# Patient Record
Sex: Male | Born: 1950
Health system: Southern US, Community
[De-identification: ages and names within clinical notes are randomized; demographics above are authoritative.]

## PROBLEM LIST (undated history)

## (undated) DIAGNOSIS — Z9889 Other specified postprocedural states: Secondary | ICD-10-CM

## (undated) DIAGNOSIS — N401 Enlarged prostate with lower urinary tract symptoms: Secondary | ICD-10-CM

## (undated) DIAGNOSIS — Z973 Presence of spectacles and contact lenses: Secondary | ICD-10-CM

## (undated) DIAGNOSIS — F329 Major depressive disorder, single episode, unspecified: Secondary | ICD-10-CM

## (undated) DIAGNOSIS — F411 Generalized anxiety disorder: Secondary | ICD-10-CM

## (undated) DIAGNOSIS — E785 Hyperlipidemia, unspecified: Secondary | ICD-10-CM

## (undated) DIAGNOSIS — E7800 Pure hypercholesterolemia, unspecified: Secondary | ICD-10-CM

## (undated) DIAGNOSIS — F32A Depression, unspecified: Secondary | ICD-10-CM

## (undated) DIAGNOSIS — Z8673 Personal history of transient ischemic attack (TIA), and cerebral infarction without residual deficits: Secondary | ICD-10-CM

## (undated) HISTORY — PX: TONSILLECTOMY: SUR1361

## (undated) HISTORY — DX: Pure hypercholesterolemia, unspecified: E78.00

## (undated) HISTORY — DX: Depression, unspecified: F32.A

---

## 1999-10-27 ENCOUNTER — Encounter: Payer: Self-pay | Admitting: Family Medicine

## 1999-10-27 ENCOUNTER — Encounter: Admission: RE | Admit: 1999-10-27 | Discharge: 1999-10-27 | Payer: Self-pay | Admitting: Family Medicine

## 2007-01-09 HISTORY — PX: COLONOSCOPY: SHX174

## 2013-02-02 DIAGNOSIS — J301 Allergic rhinitis due to pollen: Secondary | ICD-10-CM | POA: Diagnosis not present

## 2013-02-02 DIAGNOSIS — J343 Hypertrophy of nasal turbinates: Secondary | ICD-10-CM | POA: Diagnosis not present

## 2013-02-02 DIAGNOSIS — J342 Deviated nasal septum: Secondary | ICD-10-CM | POA: Diagnosis not present

## 2013-02-02 DIAGNOSIS — J328 Other chronic sinusitis: Secondary | ICD-10-CM | POA: Diagnosis not present

## 2013-02-23 DIAGNOSIS — J343 Hypertrophy of nasal turbinates: Secondary | ICD-10-CM | POA: Diagnosis not present

## 2013-02-23 DIAGNOSIS — J328 Other chronic sinusitis: Secondary | ICD-10-CM | POA: Diagnosis not present

## 2013-02-25 DIAGNOSIS — J301 Allergic rhinitis due to pollen: Secondary | ICD-10-CM | POA: Diagnosis not present

## 2013-03-24 DIAGNOSIS — H524 Presbyopia: Secondary | ICD-10-CM | POA: Diagnosis not present

## 2013-03-24 DIAGNOSIS — H5210 Myopia, unspecified eye: Secondary | ICD-10-CM | POA: Diagnosis not present

## 2013-03-24 DIAGNOSIS — H2510 Age-related nuclear cataract, unspecified eye: Secondary | ICD-10-CM | POA: Diagnosis not present

## 2013-03-24 DIAGNOSIS — H52229 Regular astigmatism, unspecified eye: Secondary | ICD-10-CM | POA: Diagnosis not present

## 2013-10-14 DIAGNOSIS — L821 Other seborrheic keratosis: Secondary | ICD-10-CM | POA: Diagnosis not present

## 2013-10-14 DIAGNOSIS — L570 Actinic keratosis: Secondary | ICD-10-CM | POA: Diagnosis not present

## 2013-10-14 DIAGNOSIS — D492 Neoplasm of unspecified behavior of bone, soft tissue, and skin: Secondary | ICD-10-CM | POA: Diagnosis not present

## 2013-12-16 DIAGNOSIS — L570 Actinic keratosis: Secondary | ICD-10-CM | POA: Diagnosis not present

## 2014-10-18 DIAGNOSIS — Z23 Encounter for immunization: Secondary | ICD-10-CM | POA: Diagnosis not present

## 2014-12-07 DIAGNOSIS — Z125 Encounter for screening for malignant neoplasm of prostate: Secondary | ICD-10-CM | POA: Diagnosis not present

## 2014-12-07 DIAGNOSIS — Z0000 Encounter for general adult medical examination without abnormal findings: Secondary | ICD-10-CM | POA: Diagnosis not present

## 2014-12-07 DIAGNOSIS — Z23 Encounter for immunization: Secondary | ICD-10-CM | POA: Diagnosis not present

## 2014-12-07 DIAGNOSIS — E7800 Pure hypercholesterolemia, unspecified: Secondary | ICD-10-CM | POA: Diagnosis not present

## 2014-12-13 DIAGNOSIS — Z1211 Encounter for screening for malignant neoplasm of colon: Secondary | ICD-10-CM | POA: Diagnosis not present

## 2014-12-14 DIAGNOSIS — Z125 Encounter for screening for malignant neoplasm of prostate: Secondary | ICD-10-CM | POA: Diagnosis not present

## 2014-12-14 DIAGNOSIS — Z0000 Encounter for general adult medical examination without abnormal findings: Secondary | ICD-10-CM | POA: Diagnosis not present

## 2014-12-14 DIAGNOSIS — E785 Hyperlipidemia, unspecified: Secondary | ICD-10-CM | POA: Diagnosis not present

## 2014-12-14 DIAGNOSIS — E7800 Pure hypercholesterolemia, unspecified: Secondary | ICD-10-CM | POA: Diagnosis not present

## 2014-12-16 DIAGNOSIS — L821 Other seborrheic keratosis: Secondary | ICD-10-CM | POA: Diagnosis not present

## 2014-12-16 DIAGNOSIS — L218 Other seborrheic dermatitis: Secondary | ICD-10-CM | POA: Diagnosis not present

## 2014-12-16 DIAGNOSIS — C44329 Squamous cell carcinoma of skin of other parts of face: Secondary | ICD-10-CM | POA: Diagnosis not present

## 2014-12-16 DIAGNOSIS — L812 Freckles: Secondary | ICD-10-CM | POA: Diagnosis not present

## 2014-12-16 DIAGNOSIS — L570 Actinic keratosis: Secondary | ICD-10-CM | POA: Diagnosis not present

## 2014-12-16 DIAGNOSIS — D485 Neoplasm of uncertain behavior of skin: Secondary | ICD-10-CM | POA: Diagnosis not present

## 2014-12-16 DIAGNOSIS — D1801 Hemangioma of skin and subcutaneous tissue: Secondary | ICD-10-CM | POA: Diagnosis not present

## 2014-12-28 DIAGNOSIS — C44329 Squamous cell carcinoma of skin of other parts of face: Secondary | ICD-10-CM | POA: Diagnosis not present

## 2015-03-29 DIAGNOSIS — R972 Elevated prostate specific antigen [PSA]: Secondary | ICD-10-CM | POA: Diagnosis not present

## 2015-03-29 DIAGNOSIS — E7800 Pure hypercholesterolemia, unspecified: Secondary | ICD-10-CM | POA: Diagnosis not present

## 2015-11-15 DIAGNOSIS — J069 Acute upper respiratory infection, unspecified: Secondary | ICD-10-CM | POA: Diagnosis not present

## 2015-11-24 DIAGNOSIS — Z23 Encounter for immunization: Secondary | ICD-10-CM | POA: Diagnosis not present

## 2015-12-08 DIAGNOSIS — D1801 Hemangioma of skin and subcutaneous tissue: Secondary | ICD-10-CM | POA: Diagnosis not present

## 2015-12-08 DIAGNOSIS — L814 Other melanin hyperpigmentation: Secondary | ICD-10-CM | POA: Diagnosis not present

## 2015-12-08 DIAGNOSIS — D225 Melanocytic nevi of trunk: Secondary | ICD-10-CM | POA: Diagnosis not present

## 2015-12-08 DIAGNOSIS — L570 Actinic keratosis: Secondary | ICD-10-CM | POA: Diagnosis not present

## 2015-12-08 DIAGNOSIS — L821 Other seborrheic keratosis: Secondary | ICD-10-CM | POA: Diagnosis not present

## 2015-12-08 DIAGNOSIS — Z85828 Personal history of other malignant neoplasm of skin: Secondary | ICD-10-CM | POA: Diagnosis not present

## 2015-12-13 DIAGNOSIS — E785 Hyperlipidemia, unspecified: Secondary | ICD-10-CM | POA: Diagnosis not present

## 2015-12-13 DIAGNOSIS — Z0000 Encounter for general adult medical examination without abnormal findings: Secondary | ICD-10-CM | POA: Diagnosis not present

## 2015-12-13 DIAGNOSIS — M545 Low back pain: Secondary | ICD-10-CM | POA: Diagnosis not present

## 2015-12-13 DIAGNOSIS — Z125 Encounter for screening for malignant neoplasm of prostate: Secondary | ICD-10-CM | POA: Diagnosis not present

## 2016-10-12 DIAGNOSIS — Z23 Encounter for immunization: Secondary | ICD-10-CM | POA: Diagnosis not present

## 2016-11-09 DIAGNOSIS — K649 Unspecified hemorrhoids: Secondary | ICD-10-CM | POA: Diagnosis not present

## 2016-11-09 DIAGNOSIS — M79672 Pain in left foot: Secondary | ICD-10-CM | POA: Diagnosis not present

## 2016-12-13 DIAGNOSIS — E785 Hyperlipidemia, unspecified: Secondary | ICD-10-CM | POA: Diagnosis not present

## 2016-12-13 DIAGNOSIS — Z125 Encounter for screening for malignant neoplasm of prostate: Secondary | ICD-10-CM | POA: Diagnosis not present

## 2016-12-13 DIAGNOSIS — Z1211 Encounter for screening for malignant neoplasm of colon: Secondary | ICD-10-CM | POA: Diagnosis not present

## 2016-12-13 DIAGNOSIS — Z0000 Encounter for general adult medical examination without abnormal findings: Secondary | ICD-10-CM | POA: Diagnosis not present

## 2016-12-25 DIAGNOSIS — E785 Hyperlipidemia, unspecified: Secondary | ICD-10-CM | POA: Diagnosis not present

## 2016-12-25 DIAGNOSIS — Z125 Encounter for screening for malignant neoplasm of prostate: Secondary | ICD-10-CM | POA: Diagnosis not present

## 2016-12-25 DIAGNOSIS — Z0000 Encounter for general adult medical examination without abnormal findings: Secondary | ICD-10-CM | POA: Diagnosis not present

## 2017-03-28 DIAGNOSIS — E785 Hyperlipidemia, unspecified: Secondary | ICD-10-CM | POA: Diagnosis not present

## 2017-03-28 DIAGNOSIS — R972 Elevated prostate specific antigen [PSA]: Secondary | ICD-10-CM | POA: Diagnosis not present

## 2017-04-04 DIAGNOSIS — D1801 Hemangioma of skin and subcutaneous tissue: Secondary | ICD-10-CM | POA: Diagnosis not present

## 2017-04-04 DIAGNOSIS — Z85828 Personal history of other malignant neoplasm of skin: Secondary | ICD-10-CM | POA: Diagnosis not present

## 2017-04-04 DIAGNOSIS — L814 Other melanin hyperpigmentation: Secondary | ICD-10-CM | POA: Diagnosis not present

## 2017-04-04 DIAGNOSIS — B351 Tinea unguium: Secondary | ICD-10-CM | POA: Diagnosis not present

## 2017-05-23 DIAGNOSIS — L609 Nail disorder, unspecified: Secondary | ICD-10-CM | POA: Diagnosis not present

## 2017-06-25 ENCOUNTER — Ambulatory Visit (INDEPENDENT_AMBULATORY_CARE_PROVIDER_SITE_OTHER): Payer: Medicare Other | Admitting: Podiatry

## 2017-06-25 VITALS — BP 166/92 | HR 63

## 2017-06-25 DIAGNOSIS — M2010 Hallux valgus (acquired), unspecified foot: Secondary | ICD-10-CM | POA: Diagnosis not present

## 2017-06-25 DIAGNOSIS — L608 Other nail disorders: Secondary | ICD-10-CM | POA: Diagnosis not present

## 2017-06-25 DIAGNOSIS — A499 Bacterial infection, unspecified: Secondary | ICD-10-CM | POA: Diagnosis not present

## 2017-06-25 DIAGNOSIS — L603 Nail dystrophy: Secondary | ICD-10-CM | POA: Diagnosis not present

## 2017-06-25 NOTE — Progress Notes (Signed)
Subjective:   Patient ID: Oscar Haley, male   DOB: 67 y.o.   MRN: [id number]   HPI  67 year old male presents the office today for concerns of his right big toenail occasionally causing some discomfort.  He says the nail is loose and is actually come off one time.  He denies any drainage or pus and denies any redness or drainage or any swelling.  He has no pain in the nail today but he states that certain times of pressure becomes painful.  He has nail fungus in the other nails but she has not been concerned about.  He also has bunions to both his feet that he mentions however again or not causing any pain or irritation.  Review of Systems  All other systems reviewed and are negative.  No past medical history on file.     Current Outpatient Medications:  .  buPROPion (WELLBUTRIN XL) 300 MG 24 hr tablet, , Disp: , Rfl:  .  FLUoxetine (PROZAC) 40 MG capsule, , Disp: , Rfl:  .  rosuvastatin (CRESTOR) 10 MG tablet, , Disp: , Rfl:   Not on File       Objective:  Physical Exam  General: AAO x3, NAD  Dermatological: Overall history be hypertrophic, dystrophic ill-defined discoloration.  The right hallux toenail is more dystrophic and is loose and underlying nail bed lesion on the proximal aspect.  There is no pain in the nails there is no surrounding redness or drainage or any clinical signs of infection present.  No open lesions are identified.  Vascular: Dorsalis Pedis artery and Posterior Tibial artery pedal pulses are 2/4 bilateral with immedate capillary fill time. There is no pain with calf compression, swelling, warmth, erythema.   Neruologic: Grossly intact via light touch bilateral.  Protective threshold with Semmes Wienstein monofilament intact to all pedal sites bilateral.   Musculoskeletal: No pain to the toenails today.  Significant HAV is present but there is no pain to the area.  No other areas of tenderness.  Muscular strength 5/5 in all groups tested  bilateral.  Gait: Unassisted, Nonantalgic.       Assessment:   67 year old male with right symptomatic onychodystrophy, onychomycosis, asymptomatic bunions     Plan:  -Treatment options discussed including all alternatives, risks, and complications -Etiology of symptoms were discussed -At this time the right hallux was not helping we discussed nail avulsion but he wants to hold off on that.  He states the nails, before it is been tender.  I am not sure that traditional antifungals can be helpful given the dystrophy to the nail.  I did culture the nail today I debrided the nail that any comp occasions of bleeding I sent this for pathology.  Follow-up of the nail culture sooner if needed.  Trula Slade DPM

## 2017-07-04 DIAGNOSIS — R972 Elevated prostate specific antigen [PSA]: Secondary | ICD-10-CM | POA: Diagnosis not present

## 2017-07-22 ENCOUNTER — Telehealth: Payer: Self-pay | Admitting: Podiatry

## 2017-07-22 NOTE — Telephone Encounter (Signed)
Pt was calling about biopsy results from 06/25/2017. Please give him call to let pt know what to do next

## 2017-07-22 NOTE — Telephone Encounter (Signed)
The culture did not show fungus. We can try urea cream.   Not sure why that did not get resulted to me.

## 2017-07-23 NOTE — Telephone Encounter (Signed)
I informed pt of Dr. Leigh Aurora review of results and recommendation for Revitaderm40, and it's use and cost. Pt states he may stop by the [place] office to pick up.

## 2017-08-26 DIAGNOSIS — H43813 Vitreous degeneration, bilateral: Secondary | ICD-10-CM | POA: Diagnosis not present

## 2017-08-26 DIAGNOSIS — H2513 Age-related nuclear cataract, bilateral: Secondary | ICD-10-CM | POA: Diagnosis not present

## 2017-08-26 DIAGNOSIS — H52223 Regular astigmatism, bilateral: Secondary | ICD-10-CM | POA: Diagnosis not present

## 2017-08-26 DIAGNOSIS — H524 Presbyopia: Secondary | ICD-10-CM | POA: Diagnosis not present

## 2017-08-26 DIAGNOSIS — H5213 Myopia, bilateral: Secondary | ICD-10-CM | POA: Diagnosis not present

## 2017-10-06 DIAGNOSIS — J029 Acute pharyngitis, unspecified: Secondary | ICD-10-CM | POA: Diagnosis not present

## 2017-10-06 DIAGNOSIS — J069 Acute upper respiratory infection, unspecified: Secondary | ICD-10-CM | POA: Diagnosis not present

## 2017-10-10 DIAGNOSIS — R52 Pain, unspecified: Secondary | ICD-10-CM | POA: Diagnosis not present

## 2017-10-10 DIAGNOSIS — R062 Wheezing: Secondary | ICD-10-CM | POA: Diagnosis not present

## 2017-10-10 DIAGNOSIS — J988 Other specified respiratory disorders: Secondary | ICD-10-CM | POA: Diagnosis not present

## 2017-10-17 DIAGNOSIS — R972 Elevated prostate specific antigen [PSA]: Secondary | ICD-10-CM | POA: Diagnosis not present

## 2017-11-01 DIAGNOSIS — Z23 Encounter for immunization: Secondary | ICD-10-CM | POA: Diagnosis not present

## 2017-11-14 DIAGNOSIS — N401 Enlarged prostate with lower urinary tract symptoms: Secondary | ICD-10-CM | POA: Diagnosis not present

## 2017-11-14 DIAGNOSIS — R350 Frequency of micturition: Secondary | ICD-10-CM | POA: Diagnosis not present

## 2017-11-14 DIAGNOSIS — R972 Elevated prostate specific antigen [PSA]: Secondary | ICD-10-CM | POA: Diagnosis not present

## 2018-01-21 DIAGNOSIS — E785 Hyperlipidemia, unspecified: Secondary | ICD-10-CM | POA: Diagnosis not present

## 2018-01-21 DIAGNOSIS — R972 Elevated prostate specific antigen [PSA]: Secondary | ICD-10-CM | POA: Diagnosis not present

## 2018-01-21 DIAGNOSIS — Z1211 Encounter for screening for malignant neoplasm of colon: Secondary | ICD-10-CM | POA: Diagnosis not present

## 2018-01-21 DIAGNOSIS — Z0000 Encounter for general adult medical examination without abnormal findings: Secondary | ICD-10-CM | POA: Diagnosis not present

## 2018-01-21 DIAGNOSIS — R69 Illness, unspecified: Secondary | ICD-10-CM | POA: Diagnosis not present

## 2018-02-06 DIAGNOSIS — R972 Elevated prostate specific antigen [PSA]: Secondary | ICD-10-CM | POA: Diagnosis not present

## 2018-02-13 DIAGNOSIS — N401 Enlarged prostate with lower urinary tract symptoms: Secondary | ICD-10-CM | POA: Diagnosis not present

## 2018-02-13 DIAGNOSIS — R972 Elevated prostate specific antigen [PSA]: Secondary | ICD-10-CM | POA: Diagnosis not present

## 2018-02-13 DIAGNOSIS — R350 Frequency of micturition: Secondary | ICD-10-CM | POA: Diagnosis not present

## 2018-08-12 DIAGNOSIS — R972 Elevated prostate specific antigen [PSA]: Secondary | ICD-10-CM | POA: Diagnosis not present

## 2018-08-18 DIAGNOSIS — L259 Unspecified contact dermatitis, unspecified cause: Secondary | ICD-10-CM | POA: Diagnosis not present

## 2018-08-19 DIAGNOSIS — R350 Frequency of micturition: Secondary | ICD-10-CM | POA: Diagnosis not present

## 2018-08-19 DIAGNOSIS — R972 Elevated prostate specific antigen [PSA]: Secondary | ICD-10-CM | POA: Diagnosis not present

## 2018-08-19 DIAGNOSIS — N401 Enlarged prostate with lower urinary tract symptoms: Secondary | ICD-10-CM | POA: Diagnosis not present

## 2018-10-07 DIAGNOSIS — Z23 Encounter for immunization: Secondary | ICD-10-CM | POA: Diagnosis not present

## 2018-11-27 DIAGNOSIS — I10 Essential (primary) hypertension: Secondary | ICD-10-CM | POA: Diagnosis not present

## 2018-11-27 DIAGNOSIS — R69 Illness, unspecified: Secondary | ICD-10-CM | POA: Diagnosis not present

## 2019-01-15 DIAGNOSIS — R69 Illness, unspecified: Secondary | ICD-10-CM | POA: Diagnosis not present

## 2019-02-02 DIAGNOSIS — E785 Hyperlipidemia, unspecified: Secondary | ICD-10-CM | POA: Diagnosis not present

## 2019-02-02 DIAGNOSIS — R69 Illness, unspecified: Secondary | ICD-10-CM | POA: Diagnosis not present

## 2019-02-02 DIAGNOSIS — R972 Elevated prostate specific antigen [PSA]: Secondary | ICD-10-CM | POA: Diagnosis not present

## 2019-02-02 DIAGNOSIS — Z0000 Encounter for general adult medical examination without abnormal findings: Secondary | ICD-10-CM | POA: Diagnosis not present

## 2019-02-02 DIAGNOSIS — Z1211 Encounter for screening for malignant neoplasm of colon: Secondary | ICD-10-CM | POA: Diagnosis not present

## 2019-02-17 DIAGNOSIS — E785 Hyperlipidemia, unspecified: Secondary | ICD-10-CM | POA: Diagnosis not present

## 2019-02-17 DIAGNOSIS — Z136 Encounter for screening for cardiovascular disorders: Secondary | ICD-10-CM | POA: Diagnosis not present

## 2019-02-17 DIAGNOSIS — Z0000 Encounter for general adult medical examination without abnormal findings: Secondary | ICD-10-CM | POA: Diagnosis not present

## 2019-02-24 DIAGNOSIS — R972 Elevated prostate specific antigen [PSA]: Secondary | ICD-10-CM | POA: Diagnosis not present

## 2019-03-03 DIAGNOSIS — N401 Enlarged prostate with lower urinary tract symptoms: Secondary | ICD-10-CM | POA: Diagnosis not present

## 2019-03-03 DIAGNOSIS — R972 Elevated prostate specific antigen [PSA]: Secondary | ICD-10-CM | POA: Diagnosis not present

## 2019-03-03 DIAGNOSIS — Z0000 Encounter for general adult medical examination without abnormal findings: Secondary | ICD-10-CM | POA: Diagnosis not present

## 2019-03-03 DIAGNOSIS — Z1211 Encounter for screening for malignant neoplasm of colon: Secondary | ICD-10-CM | POA: Diagnosis not present

## 2019-03-03 DIAGNOSIS — R350 Frequency of micturition: Secondary | ICD-10-CM | POA: Diagnosis not present

## 2019-04-21 DIAGNOSIS — R42 Dizziness and giddiness: Secondary | ICD-10-CM | POA: Diagnosis not present

## 2019-04-21 DIAGNOSIS — J019 Acute sinusitis, unspecified: Secondary | ICD-10-CM | POA: Diagnosis not present

## 2019-05-19 DIAGNOSIS — R69 Illness, unspecified: Secondary | ICD-10-CM | POA: Diagnosis not present

## 2019-05-28 DIAGNOSIS — R69 Illness, unspecified: Secondary | ICD-10-CM | POA: Diagnosis not present

## 2019-06-04 DIAGNOSIS — L570 Actinic keratosis: Secondary | ICD-10-CM | POA: Diagnosis not present

## 2019-06-04 DIAGNOSIS — B078 Other viral warts: Secondary | ICD-10-CM | POA: Diagnosis not present

## 2019-06-04 DIAGNOSIS — D225 Melanocytic nevi of trunk: Secondary | ICD-10-CM | POA: Diagnosis not present

## 2019-06-04 DIAGNOSIS — L853 Xerosis cutis: Secondary | ICD-10-CM | POA: Diagnosis not present

## 2019-06-04 DIAGNOSIS — Z85828 Personal history of other malignant neoplasm of skin: Secondary | ICD-10-CM | POA: Diagnosis not present

## 2019-06-04 DIAGNOSIS — D1801 Hemangioma of skin and subcutaneous tissue: Secondary | ICD-10-CM | POA: Diagnosis not present

## 2019-06-04 DIAGNOSIS — L905 Scar conditions and fibrosis of skin: Secondary | ICD-10-CM | POA: Diagnosis not present

## 2019-06-04 DIAGNOSIS — L820 Inflamed seborrheic keratosis: Secondary | ICD-10-CM | POA: Diagnosis not present

## 2019-06-04 DIAGNOSIS — L249 Irritant contact dermatitis, unspecified cause: Secondary | ICD-10-CM | POA: Diagnosis not present

## 2019-06-04 DIAGNOSIS — L821 Other seborrheic keratosis: Secondary | ICD-10-CM | POA: Diagnosis not present

## 2019-10-08 DIAGNOSIS — Z23 Encounter for immunization: Secondary | ICD-10-CM | POA: Diagnosis not present

## 2019-10-13 DIAGNOSIS — R69 Illness, unspecified: Secondary | ICD-10-CM | POA: Diagnosis not present

## 2020-02-18 DIAGNOSIS — L309 Dermatitis, unspecified: Secondary | ICD-10-CM | POA: Diagnosis not present

## 2020-02-18 DIAGNOSIS — R7309 Other abnormal glucose: Secondary | ICD-10-CM | POA: Diagnosis not present

## 2020-02-18 DIAGNOSIS — R69 Illness, unspecified: Secondary | ICD-10-CM | POA: Diagnosis not present

## 2020-02-18 DIAGNOSIS — E785 Hyperlipidemia, unspecified: Secondary | ICD-10-CM | POA: Diagnosis not present

## 2020-02-18 DIAGNOSIS — Z0000 Encounter for general adult medical examination without abnormal findings: Secondary | ICD-10-CM | POA: Diagnosis not present

## 2020-02-18 DIAGNOSIS — Z1211 Encounter for screening for malignant neoplasm of colon: Secondary | ICD-10-CM | POA: Diagnosis not present

## 2020-02-23 DIAGNOSIS — R7309 Other abnormal glucose: Secondary | ICD-10-CM | POA: Diagnosis not present

## 2020-02-23 DIAGNOSIS — E785 Hyperlipidemia, unspecified: Secondary | ICD-10-CM | POA: Diagnosis not present

## 2020-02-26 DIAGNOSIS — Z1211 Encounter for screening for malignant neoplasm of colon: Secondary | ICD-10-CM | POA: Diagnosis not present

## 2020-07-04 DIAGNOSIS — L905 Scar conditions and fibrosis of skin: Secondary | ICD-10-CM | POA: Diagnosis not present

## 2020-07-04 DIAGNOSIS — L570 Actinic keratosis: Secondary | ICD-10-CM | POA: Diagnosis not present

## 2020-07-04 DIAGNOSIS — Z85828 Personal history of other malignant neoplasm of skin: Secondary | ICD-10-CM | POA: Diagnosis not present

## 2020-07-04 DIAGNOSIS — B356 Tinea cruris: Secondary | ICD-10-CM | POA: Diagnosis not present

## 2020-07-04 DIAGNOSIS — L821 Other seborrheic keratosis: Secondary | ICD-10-CM | POA: Diagnosis not present

## 2020-07-04 DIAGNOSIS — D1801 Hemangioma of skin and subcutaneous tissue: Secondary | ICD-10-CM | POA: Diagnosis not present

## 2020-09-19 ENCOUNTER — Ambulatory Visit
Admission: RE | Admit: 2020-09-19 | Discharge: 2020-09-19 | Disposition: A | Discharge status: Home / Self Care | Payer: Medicare HMO | Source: Ambulatory Visit | Attending: Family Medicine | Admitting: Family Medicine

## 2020-09-19 ENCOUNTER — Other Ambulatory Visit: Payer: Self-pay | Admitting: Family Medicine

## 2020-09-19 DIAGNOSIS — R059 Cough, unspecified: Secondary | ICD-10-CM

## 2020-09-19 DIAGNOSIS — R42 Dizziness and giddiness: Secondary | ICD-10-CM | POA: Diagnosis not present

## 2020-09-19 DIAGNOSIS — J3489 Other specified disorders of nose and nasal sinuses: Secondary | ICD-10-CM | POA: Diagnosis not present

## 2020-09-27 DIAGNOSIS — Z23 Encounter for immunization: Secondary | ICD-10-CM | POA: Diagnosis not present

## 2020-11-08 DIAGNOSIS — R2689 Other abnormalities of gait and mobility: Secondary | ICD-10-CM | POA: Diagnosis not present

## 2020-11-15 DIAGNOSIS — H53143 Visual discomfort, bilateral: Secondary | ICD-10-CM | POA: Diagnosis not present

## 2020-12-05 ENCOUNTER — Encounter: Payer: Self-pay | Admitting: Neurology

## 2021-02-28 NOTE — Progress Notes (Signed)
° °  NEUROLOGY CONSULTATION NOTE  Oscar Haley MRN: [id number] DOB: 05-07-1950  Referring provider: London Pepper, MD Primary care provider: London Pepper, MD  Reason for consult:  dizziness  Assessment/Plan:   Disequilibrium - unclear etiology.  ENT did not think it was inner ear.  Unclear if he may have had a small cerebellar stroke.  Check MRI of brain Further recommendations pending results.   Subjective:  Oscar Haley is a 71 year old right-handed male with anxiety and HLD who presents for dizziness.  History supplemented by ENT and referring provider's notes.   While on a cruise in [place], he got up out of bed and just tipped over to the left.  It lasted a few seconds.  No associated double vision, numbness, weakness, lightheadedness or vertigo.  He continued to have spells after coming home, usually triggered when standing up, looking up or with quick head movements.  They would occur most days.  At first he had slight head pressure, so he was treated with antibiotics for presumed sinusitis but symptoms did not resolve.  Symptoms became more frequent.  He feels like he is leaning to the left.  He saw ENT in November and found to have a normal exam.  Then it resolved in December.  He has felt fine since then.  Denies prior history of vertigo.      PAST MEDICAL HISTORY: Hyperlipidemia Anxiety  MEDICATIONS: Current Outpatient Medications on File Prior to Visit  Medication Sig Dispense Refill   buPROPion (WELLBUTRIN XL) 300 MG 24 hr tablet      FLUoxetine (PROZAC) 40 MG capsule      rosuvastatin (CRESTOR) 10 MG tablet      No current facility-administered medications on file prior to visit.    ALLERGIES: NKA   Objective:  Blood pressure 132/75, pulse 68, height 5\' 3"  (1.6 m), weight 139 lb 6.4 oz (63.2 kg), SpO2 93 %. General: No acute distress.  Patient appears well-groomed.   Head:  Normocephalic/atraumatic Eyes:  fundi examined but not visualized Neck:  supple, no paraspinal tenderness, full range of motion Back: No paraspinal tenderness Heart: regular rate and rhythm Lungs: Clear to auscultation bilaterally. Vascular: No carotid bruits. Neurological Exam: Mental status: alert and oriented to person, place, and time, recent and remote memory intact, fund of knowledge intact, attention and concentration intact, speech fluent and not dysarthric, language intact. Cranial nerves: CN I: not tested CN II: pupils equal, round and reactive to light, visual fields intact CN III, IV, VI:  full range of motion, no nystagmus, no ptosis CN V: facial sensation intact. CN VII: upper and lower face symmetric CN VIII: hearing intact CN IX, X: gag intact, uvula midline CN XI: sternocleidomastoid and trapezius muscles intact CN XII: tongue midline Bulk & Tone: normal, no fasciculations. Motor:  muscle strength 5/5 throughout Sensation:  Pinprick, temperature and vibratory sensation intact. Deep Tendon Reflexes:  2+ throughout,  toes downgoing.   Finger to nose testing:  Without dysmetria.   Heel to shin:  Without dysmetria.   Gait:  Normal station and stride.  Romberg negative.    Thank you for allowing me to take part in the care of this patient.  Metta Clines, DO  CC: London Pepper, MD

## 2021-03-01 ENCOUNTER — Ambulatory Visit: Payer: Medicare HMO | Admitting: Neurology

## 2021-03-01 ENCOUNTER — Encounter: Payer: Self-pay | Admitting: Neurology

## 2021-03-01 ENCOUNTER — Other Ambulatory Visit: Payer: Self-pay

## 2021-03-01 VITALS — BP 132/75 | HR 68 | Ht 63.0 in | Wt 139.4 lb

## 2021-03-01 DIAGNOSIS — R270 Ataxia, unspecified: Secondary | ICD-10-CM | POA: Diagnosis not present

## 2021-03-01 NOTE — Patient Instructions (Signed)
Will check MRI of brain.  Further recommendations pending results Otherwise, follow up as needed

## 2021-03-13 ENCOUNTER — Ambulatory Visit
Admission: RE | Admit: 2021-03-13 | Discharge: 2021-03-13 | Disposition: A | Discharge status: Home / Self Care | Payer: Medicare HMO | Source: Ambulatory Visit | Attending: Neurology | Admitting: Neurology

## 2021-03-13 ENCOUNTER — Other Ambulatory Visit: Payer: Self-pay

## 2021-03-13 DIAGNOSIS — R270 Ataxia, unspecified: Secondary | ICD-10-CM | POA: Diagnosis not present

## 2021-03-13 DIAGNOSIS — R42 Dizziness and giddiness: Secondary | ICD-10-CM | POA: Diagnosis not present

## 2021-03-14 DIAGNOSIS — R062 Wheezing: Secondary | ICD-10-CM | POA: Diagnosis not present

## 2021-03-14 DIAGNOSIS — Z1211 Encounter for screening for malignant neoplasm of colon: Secondary | ICD-10-CM | POA: Diagnosis not present

## 2021-03-14 DIAGNOSIS — K649 Unspecified hemorrhoids: Secondary | ICD-10-CM | POA: Diagnosis not present

## 2021-03-14 DIAGNOSIS — Z125 Encounter for screening for malignant neoplasm of prostate: Secondary | ICD-10-CM | POA: Diagnosis not present

## 2021-03-14 DIAGNOSIS — M6281 Muscle weakness (generalized): Secondary | ICD-10-CM | POA: Diagnosis not present

## 2021-03-14 DIAGNOSIS — R69 Illness, unspecified: Secondary | ICD-10-CM | POA: Diagnosis not present

## 2021-03-14 DIAGNOSIS — R252 Cramp and spasm: Secondary | ICD-10-CM | POA: Diagnosis not present

## 2021-03-14 DIAGNOSIS — R7309 Other abnormal glucose: Secondary | ICD-10-CM | POA: Diagnosis not present

## 2021-03-14 DIAGNOSIS — E785 Hyperlipidemia, unspecified: Secondary | ICD-10-CM | POA: Diagnosis not present

## 2021-03-14 DIAGNOSIS — Z0000 Encounter for general adult medical examination without abnormal findings: Secondary | ICD-10-CM | POA: Diagnosis not present

## 2021-03-15 ENCOUNTER — Telehealth: Payer: Self-pay | Admitting: Neurology

## 2021-03-15 ENCOUNTER — Telehealth: Payer: Self-pay

## 2021-03-15 DIAGNOSIS — I639 Cerebral infarction, unspecified: Secondary | ICD-10-CM

## 2021-03-15 NOTE — Telephone Encounter (Signed)
-----   Message from Pieter Partridge, DO sent at 03/14/2021  7:18 AM EST ----- ?MRI of brain reveals an old small stroke in the back of the brain that may explain balance problems that started back in August.  Therefore, I would say that the balance problems are likely from this old stroke.  He is already on Crestor.  I would like him to start an aspirin '81mg'$  daily.  I would like to check tests that may reveal potential causes of stroke - CTA head and neck, echocardiogram.  If these are unremarkable, I would recommend 2 week holter monitor.  ?

## 2021-03-15 NOTE — Telephone Encounter (Signed)
Patient said he recvd a call 2x from this number, unsure what about ?

## 2021-03-15 NOTE — Progress Notes (Signed)
Tried calling pt no answer, LMOVM for the patient to call the office back.

## 2021-03-16 NOTE — Telephone Encounter (Signed)
See results note. 

## 2021-03-24 NOTE — Progress Notes (Signed)
Telephone call to [employer]: PA started will review the Auth and get back to [place]. ?Pending case # [id number] ?Fax clinical notes [phone] if needed. Insurance will let us know. ?

## 2021-03-24 NOTE — Progress Notes (Signed)
Called insurance wrong Codes given, ?[id number]  NO Required.  ?

## 2021-03-28 ENCOUNTER — Ambulatory Visit
Admission: RE | Admit: 2021-03-28 | Discharge: 2021-03-28 | Disposition: A | Discharge status: Home / Self Care | Payer: Medicare HMO | Source: Ambulatory Visit | Attending: Neurology | Admitting: Neurology

## 2021-03-28 DIAGNOSIS — I700 Atherosclerosis of aorta: Secondary | ICD-10-CM | POA: Diagnosis not present

## 2021-03-28 DIAGNOSIS — I639 Cerebral infarction, unspecified: Secondary | ICD-10-CM

## 2021-03-28 DIAGNOSIS — I63233 Cerebral infarction due to unspecified occlusion or stenosis of bilateral carotid arteries: Secondary | ICD-10-CM | POA: Diagnosis not present

## 2021-03-28 DIAGNOSIS — M47812 Spondylosis without myelopathy or radiculopathy, cervical region: Secondary | ICD-10-CM | POA: Diagnosis not present

## 2021-03-28 DIAGNOSIS — J329 Chronic sinusitis, unspecified: Secondary | ICD-10-CM | POA: Diagnosis not present

## 2021-03-28 DIAGNOSIS — I672 Cerebral atherosclerosis: Secondary | ICD-10-CM | POA: Diagnosis not present

## 2021-03-28 MED ORDER — IOPAMIDOL (ISOVUE-370) INJECTION 76%
75.0000 mL | Freq: Once | INTRAVENOUS | Status: AC | PRN
  Administered 2021-03-28: 75 mL via INTRAVENOUS

## 2021-03-29 ENCOUNTER — Other Ambulatory Visit: Payer: Self-pay

## 2021-03-29 ENCOUNTER — Ambulatory Visit: Payer: Medicare HMO

## 2021-03-29 DIAGNOSIS — Z0189 Encounter for other specified special examinations: Secondary | ICD-10-CM | POA: Diagnosis not present

## 2021-03-29 DIAGNOSIS — I639 Cerebral infarction, unspecified: Secondary | ICD-10-CM

## 2021-03-29 DIAGNOSIS — Z1211 Encounter for screening for malignant neoplasm of colon: Secondary | ICD-10-CM | POA: Diagnosis not present

## 2021-03-30 ENCOUNTER — Telehealth: Payer: Self-pay

## 2021-03-30 NOTE — Telephone Encounter (Signed)
Called [employer] [phone] option 1. Estill Bamberg and have scheduled a peer to peer with Dr. Tomi Likens at 8 am on 04/03/21. Dr. Hampton Abbot will call [phone] and provided Dr. Tomi Likens cell phone number as an alternate number. ? ?[phone] ?

## 2021-04-03 NOTE — Progress Notes (Signed)
Hi Adam, ?Hope you are feeling better. ?On a non-bubble study, I did not see ay shunting. If clinical suspicion for PFO is high, I would do a transthoracic bubble study or consider TEE. ?Hope that helps. ? ?Thanks ?Onesti Bonfiglio ? ?

## 2021-04-03 NOTE — Telephone Encounter (Signed)
PA not needed.  The wrong code for echo was entered.   ?

## 2021-04-05 NOTE — Progress Notes (Signed)
PT advised of his results.

## 2021-04-20 NOTE — Progress Notes (Signed)
LMOVM

## 2021-04-21 NOTE — Progress Notes (Signed)
Pt advised of his results.

## 2021-07-06 DIAGNOSIS — R972 Elevated prostate specific antigen [PSA]: Secondary | ICD-10-CM | POA: Diagnosis not present

## 2021-08-10 DIAGNOSIS — R972 Elevated prostate specific antigen [PSA]: Secondary | ICD-10-CM | POA: Diagnosis not present

## 2021-08-15 ENCOUNTER — Other Ambulatory Visit: Payer: Self-pay | Admitting: Urology

## 2021-08-15 DIAGNOSIS — R972 Elevated prostate specific antigen [PSA]: Secondary | ICD-10-CM

## 2021-08-28 ENCOUNTER — Ambulatory Visit
Admission: RE | Admit: 2021-08-28 | Discharge: 2021-08-28 | Disposition: A | Discharge status: Home / Self Care | Payer: Medicare HMO | Source: Ambulatory Visit | Attending: Urology | Admitting: Urology

## 2021-08-28 DIAGNOSIS — R972 Elevated prostate specific antigen [PSA]: Secondary | ICD-10-CM

## 2021-08-28 MED ORDER — GADOBENATE DIMEGLUMINE 529 MG/ML IV SOLN
15.0000 mL | Freq: Once | INTRAVENOUS | Status: AC | PRN
  Administered 2021-08-28: 15 mL via INTRAVENOUS

## 2021-10-05 DIAGNOSIS — L570 Actinic keratosis: Secondary | ICD-10-CM | POA: Diagnosis not present

## 2021-10-05 DIAGNOSIS — D225 Melanocytic nevi of trunk: Secondary | ICD-10-CM | POA: Diagnosis not present

## 2021-10-05 DIAGNOSIS — L814 Other melanin hyperpigmentation: Secondary | ICD-10-CM | POA: Diagnosis not present

## 2021-10-05 DIAGNOSIS — L538 Other specified erythematous conditions: Secondary | ICD-10-CM | POA: Diagnosis not present

## 2021-10-05 DIAGNOSIS — Z23 Encounter for immunization: Secondary | ICD-10-CM | POA: Diagnosis not present

## 2021-10-05 DIAGNOSIS — B078 Other viral warts: Secondary | ICD-10-CM | POA: Diagnosis not present

## 2021-10-05 DIAGNOSIS — L821 Other seborrheic keratosis: Secondary | ICD-10-CM | POA: Diagnosis not present

## 2021-11-08 DIAGNOSIS — C61 Malignant neoplasm of prostate: Secondary | ICD-10-CM

## 2021-11-08 DIAGNOSIS — D075 Carcinoma in situ of prostate: Secondary | ICD-10-CM | POA: Diagnosis not present

## 2021-11-08 HISTORY — DX: Malignant neoplasm of prostate: C61

## 2021-11-15 DIAGNOSIS — C61 Malignant neoplasm of prostate: Secondary | ICD-10-CM | POA: Diagnosis not present

## 2021-11-17 ENCOUNTER — Telehealth: Payer: Self-pay | Admitting: Radiation Oncology

## 2021-11-17 NOTE — Telephone Encounter (Signed)
Called patient to schedule a consultation w. Dr. Manning. No answer, LVM for a return call.  ?

## 2021-11-20 DIAGNOSIS — F325 Major depressive disorder, single episode, in full remission: Secondary | ICD-10-CM | POA: Diagnosis not present

## 2021-11-20 DIAGNOSIS — N529 Male erectile dysfunction, unspecified: Secondary | ICD-10-CM | POA: Diagnosis not present

## 2021-11-20 DIAGNOSIS — F419 Anxiety disorder, unspecified: Secondary | ICD-10-CM | POA: Diagnosis not present

## 2021-11-20 DIAGNOSIS — M1990 Unspecified osteoarthritis, unspecified site: Secondary | ICD-10-CM | POA: Diagnosis not present

## 2021-11-20 DIAGNOSIS — Z85828 Personal history of other malignant neoplasm of skin: Secondary | ICD-10-CM | POA: Diagnosis not present

## 2021-11-20 DIAGNOSIS — I700 Atherosclerosis of aorta: Secondary | ICD-10-CM | POA: Diagnosis not present

## 2021-11-20 DIAGNOSIS — Z87891 Personal history of nicotine dependence: Secondary | ICD-10-CM | POA: Diagnosis not present

## 2021-11-20 DIAGNOSIS — R69 Illness, unspecified: Secondary | ICD-10-CM | POA: Diagnosis not present

## 2021-11-20 DIAGNOSIS — R030 Elevated blood-pressure reading, without diagnosis of hypertension: Secondary | ICD-10-CM | POA: Diagnosis not present

## 2021-11-20 DIAGNOSIS — Z825 Family history of asthma and other chronic lower respiratory diseases: Secondary | ICD-10-CM | POA: Diagnosis not present

## 2021-11-20 DIAGNOSIS — E785 Hyperlipidemia, unspecified: Secondary | ICD-10-CM | POA: Diagnosis not present

## 2021-11-20 DIAGNOSIS — I672 Cerebral atherosclerosis: Secondary | ICD-10-CM | POA: Diagnosis not present

## 2021-11-20 DIAGNOSIS — Z809 Family history of malignant neoplasm, unspecified: Secondary | ICD-10-CM | POA: Diagnosis not present

## 2021-11-20 NOTE — Progress Notes (Signed)
GU Location of Tumor / Histology: Prostate Ca  If Prostate Cancer, Gleason Score is (4 + 3) and PSA is (5.59 as of 08/2021)  Biopsies      Past/Anticipated interventions by urology, if any:     Past/Anticipated interventions by medical oncology, if any: NA  Weight changes, if any:   IPSS: SHIM:  Bowel/Bladder complaints, if any: {:18581}   Nausea/Vomiting, if any: {:18581}  Pain issues, if any:  {:18581}  SAFETY ISSUES: Prior radiation? {:18581} Pacemaker/ICD? {:18581} Possible current pregnancy? Male Is the patient on methotrexate? No  Current Complaints / other details:

## 2021-11-23 ENCOUNTER — Ambulatory Visit
Admission: RE | Admit: 2021-11-23 | Discharge: 2021-11-23 | Disposition: A | Discharge status: Home / Self Care | Payer: Medicare HMO | Source: Ambulatory Visit | Attending: Radiation Oncology | Admitting: Radiation Oncology

## 2021-11-23 VITALS — BP 153/74 | HR 62 | Temp 97.7°F | Resp 18 | Ht 62.0 in | Wt 165.2 lb

## 2021-11-23 DIAGNOSIS — E7800 Pure hypercholesterolemia, unspecified: Secondary | ICD-10-CM | POA: Insufficient documentation

## 2021-11-23 DIAGNOSIS — Z79899 Other long term (current) drug therapy: Secondary | ICD-10-CM | POA: Diagnosis not present

## 2021-11-23 DIAGNOSIS — C61 Malignant neoplasm of prostate: Secondary | ICD-10-CM | POA: Insufficient documentation

## 2021-11-23 DIAGNOSIS — Z191 Hormone sensitive malignancy status: Secondary | ICD-10-CM | POA: Diagnosis not present

## 2021-11-23 NOTE — Progress Notes (Signed)
Radiation Oncology         [phone] ________________________________  Initial Outpatient Consultation  Name: Oscar Haley MRN: [id number]  Date: 11/23/2021  DOB: 07-18-1950  CC:London Pepper, MD  Festus Aloe, MD   REFERRING PHYSICIAN: Festus Aloe, MD  DIAGNOSIS: 71 y.o. gentleman with Stage T2a adenocarcinoma of the prostate with Gleason score of 4+3, and PSA of 11.8.    ICD-10-CM   1. Malignant neoplasm of prostate ([place])  C61       HISTORY OF PRESENT ILLNESS: Oscar Haley is a 71 y.o. male with a diagnosis of prostate cancer. He has a history of elevated, fluctuating PSA since 2019 when his PSA was 7.79 on routine labs with his primary care physician, Dr. Orland Mustard.  Accordingly, he was referred for evaluation in urology by Dr. Junious Silk on 11/14/17,  digital rectal examination was performed at that time revealing no nodularity or concerning findings. The PSA decreased to 6.09 when repeated at that time and down to 5.59 in 2021 but increased back up to 8.32 in 03/2021 and most recently, as high as 11.8 in 08/2021.   He had a prostate MRI on 08/28/21 that showed a PIRADS-5 lesion in the right anterior transition zone and a PIRADS-4 lesion in the left posterior peripheral zone. The patient proceeded to transrectal ultrasound with 12 biopsies of the prostate on 11/08/21.  The prostate volume measured 35 cc.  Out of 12 core biopsies, 4 were positive.  The maximum Gleason score was 4+3, and this was seen in the left base lateral and left apex. Additionally, there was Gleason 3+4 in the left mid lateral and Gleason 3+3 in the right apex.  The patient reviewed the biopsy results with his urologist and he has kindly been referred today for discussion of potential radiation treatment options.   PREVIOUS RADIATION THERAPY: No  PAST MEDICAL HISTORY:  Past Medical History:  Diagnosis Date   Depression    High cholesterol       PAST SURGICAL HISTORY:No past surgical history on  file.  FAMILY HISTORY: No family history on file.  SOCIAL HISTORY:  Social History   Socioeconomic History   Marital status: Married    Spouse name: Not on file   Number of children: Not on file   Years of education: Not on file   Highest education level: Not on file  Occupational History   Not on file  Tobacco Use   Smoking status: Never    Passive exposure: Never   Smokeless tobacco: Never  Substance and Sexual Activity   Alcohol use: Yes    Comment: occ   Drug use: Never   Sexual activity: Not on file  Other Topics Concern   Not on file  Social History Narrative   Not on file   Social Determinants of Health   Financial Resource Strain: Not on file  Food Insecurity: Not on file  Transportation Needs: Not on file  Physical Activity: Not on file  Stress: Not on file  Social Connections: Not on file  Intimate Partner Violence: Not on file    ALLERGIES: Patient has no known allergies.  MEDICATIONS:  Current Outpatient Medications  Medication Sig Dispense Refill   BOOSTRIX 5-2.5-18.5 LF-MCG/0.5 injection      buPROPion (WELLBUTRIN XL) 300 MG 24 hr tablet      FLUoxetine (PROZAC) 40 MG capsule      rosuvastatin (CRESTOR) 10 MG tablet      No current facility-administered medications for this encounter.  REVIEW OF SYSTEMS:  On review of systems, the patient reports that he is doing well overall. He denies any chest pain, shortness of breath, cough, fevers, chills, night sweats, unintended weight changes. He denies any bowel disturbances, and denies abdominal pain, nausea or vomiting. He denies any new musculoskeletal or joint aches or pains. His IPSS was 1, indicating mild urinary symptoms. His SHIM was 11, indicating he has mild--moderate erectile dysfunction. A complete review of systems is obtained and is otherwise negative.    PHYSICAL EXAM:  Wt Readings from Last 3 Encounters:  11/23/21 165 lb 3.2 oz (74.9 kg)  03/01/21 139 lb 6.4 oz (63.2 kg)   Temp  Readings from Last 3 Encounters:  11/23/21 97.7 F (36.5 C)   BP Readings from Last 3 Encounters:  11/23/21 (!) 153/74  03/01/21 132/75  06/25/17 (!) 166/92   Pulse Readings from Last 3 Encounters:  11/23/21 62  03/01/21 68  06/25/17 63   Pain Assessment Pain Score: 0-No pain/10  In general this is a well appearing Caucasian male in no acute distress. He's alert and oriented x4 and appropriate throughout the examination. Cardiopulmonary assessment is negative for acute distress, and he exhibits normal effort.     KPS = 100  100 - Normal; no complaints; no evidence of disease. 90   - Able to carry on normal activity; minor signs or symptoms of disease. 80   - Normal activity with effort; some signs or symptoms of disease. 52   - Cares for self; unable to carry on normal activity or to do active work. 60   - Requires occasional assistance, but is able to care for most of his personal needs. 50   - Requires considerable assistance and frequent medical care. 25   - Disabled; requires special care and assistance. 63   - Severely disabled; hospital admission is indicated although death not imminent. 29   - Very sick; hospital admission necessary; active supportive treatment necessary. 10   - Moribund; fatal processes progressing rapidly. 0     - Dead  Karnofsky DA, Abelmann WH, Craver LS and Burchenal JH [phone]) The use of the nitrogen mustards in the palliative treatment of carcinoma: with particular reference to bronchogenic carcinoma Cancer 1 634-56  LABORATORY DATA:  No results found for: "WBC", "HGB", "HCT", "MCV", "PLT" No results found for: "NA", "K", "CL", "CO2" No results found for: "ALT", "AST", "GGT", "ALKPHOS", "BILITOT"   RADIOGRAPHY: No results found.    IMPRESSION/PLAN: 1. 71 y.o. gentleman with Stage T2a adenocarcinoma of the prostate with Gleason Score of 4+3, and PSA of 11.8. We discussed the patient's workup and outlined the nature of prostate cancer in this  setting. The patient's T stage, Gleason's score, and PSA put him into the unfavorable intermediate risk group. Accordingly, he is eligible for a variety of potential treatment options including  prostatectomy, brachytherapy, or 5.5 weeks of external radiation +/- ST-ADT. We discussed the available radiation techniques, and focused on the details and logistics of delivery. We discussed and outlined the risks, benefits, short and long-term effects associated with radiotherapy and compared and contrasted these with prostatectomy. We discussed the role of SpaceOAR gel in reducing the rectal toxicity associated with radiotherapy. We also detailed the role of ADT in the treatment of unfavorable intermediate risk prostate cancer and outlined the associated side effects that could be expected with this therapy. We discussed that there is not great evidence to support the need for ADT in cases with low volume  Gleason 4+3 disease and that the adverse side effects likely outweigh the small potential benefit of adding this treatment to the IMRT. He was encouraged to ask questions that were answered to his stated satisfaction.  At the conclusion of our conversation, the patient is interested in moving forward with 5.5 weeks of external beam therapy without ADT if Dr. Junious Silk is in agreement. He did not recall having any conversation regarding ADT with Dr. Junious Silk but there is some mention of possibly considering Orgovyx in his last visit note. We do not feel strongly that this is necessary but will defer to Dr. Junious Silk regarding his recommendation. We will share our discussion with Dr. Junious Silk and pending he is agreement to proceed without ADT, we will make arrangements for fiducial markers and SpaceOAR gel placement, first available, prior to simulation, to reduce rectal toxicity from radiotherapy. The patient appears to have a good understanding of his disease and our treatment recommendations which are of curative  intent and is in agreement with the stated plan.  Therefore, we will move forward with treatment planning accordingly, in anticipation of beginning IMRT in the near future or approximately 2 months after starting ADT if directed by Dr. Junious Silk.  We personally spent 70 minutes in this encounter including chart review, reviewing radiological studies, meeting face-to-face with the patient, entering orders and completing documentation.    Nicholos Johns, PA-C    Tyler Pita, MD  [facility] Direct Dial: [phone]  Fax: [phone] [email or web address]  Skype  LinkedIn

## 2021-12-04 ENCOUNTER — Telehealth: Payer: Self-pay | Admitting: *Deleted

## 2021-12-04 ENCOUNTER — Other Ambulatory Visit: Payer: Self-pay | Admitting: Urology

## 2021-12-04 NOTE — Telephone Encounter (Signed)
CALLED PATIENT TO INFORM OF FID. MARKERS AND SPACE OAR PLACEMENT ON 01-05-22 AND HIS SIM ON 01-11-22- ARRIVAL TIME- 9:45 AM @ [facility],  INFORMED PATIENT TO ARRIVE WITH A FULL BLADDER

## 2021-12-26 DIAGNOSIS — U071 COVID-19: Secondary | ICD-10-CM | POA: Diagnosis not present

## 2022-01-03 ENCOUNTER — Encounter (HOSPITAL_BASED_OUTPATIENT_CLINIC_OR_DEPARTMENT_OTHER): Payer: Self-pay | Admitting: Urology

## 2022-01-03 NOTE — Progress Notes (Signed)
Spoke w/ via phone for pre-op interview--- pt Lab needs dos----  no             Lab results------ no COVID test -----patient states asymptomatic no test needed Arrive at -------  0600 on 01-05-2022 NPO after MN NO Solid Food.  Clear liquids from MN until--- 0500 Med rec completed Medications to take morning of surgery ----- wellbutrin, prozac Diabetic medication ----- n/a Patient instructed no nail polish to be worn day of surgery Patient instructed to bring photo id and insurance card day of surgery Patient aware to have Driver (ride ) / caregiver    for 24 hours after surgery -- wife, june Patient Special Instructions ----- will do fleet enema night before surgery Pre-Op special Istructions ----- n/a Patient verbalized understanding of instructions that were given at this phone interview. Patient denies shortness of breath, chest pain, fever, cough at this phone interview.

## 2022-01-05 ENCOUNTER — Other Ambulatory Visit: Payer: Self-pay

## 2022-01-05 ENCOUNTER — Ambulatory Visit (HOSPITAL_BASED_OUTPATIENT_CLINIC_OR_DEPARTMENT_OTHER): Payer: Medicare HMO | Admitting: Certified Registered"

## 2022-01-05 ENCOUNTER — Encounter (HOSPITAL_BASED_OUTPATIENT_CLINIC_OR_DEPARTMENT_OTHER)
Admission: RE | Disposition: A | Discharge status: Home / Self Care | Payer: Self-pay | Source: Home / Self Care | Attending: Urology

## 2022-01-05 ENCOUNTER — Encounter (HOSPITAL_BASED_OUTPATIENT_CLINIC_OR_DEPARTMENT_OTHER): Payer: Self-pay | Admitting: Urology

## 2022-01-05 ENCOUNTER — Ambulatory Visit (HOSPITAL_BASED_OUTPATIENT_CLINIC_OR_DEPARTMENT_OTHER)
Admission: RE | Admit: 2022-01-05 | Discharge: 2022-01-05 | Disposition: A | Discharge status: Home / Self Care | Payer: Medicare HMO | Attending: Urology | Admitting: Urology

## 2022-01-05 DIAGNOSIS — C61 Malignant neoplasm of prostate: Secondary | ICD-10-CM | POA: Diagnosis not present

## 2022-01-05 DIAGNOSIS — Z87891 Personal history of nicotine dependence: Secondary | ICD-10-CM

## 2022-01-05 DIAGNOSIS — F418 Other specified anxiety disorders: Secondary | ICD-10-CM

## 2022-01-05 DIAGNOSIS — R69 Illness, unspecified: Secondary | ICD-10-CM | POA: Diagnosis not present

## 2022-01-05 DIAGNOSIS — Z01818 Encounter for other preprocedural examination: Secondary | ICD-10-CM

## 2022-01-05 HISTORY — DX: Presence of spectacles and contact lenses: Z97.3

## 2022-01-05 HISTORY — PX: GOLD SEED IMPLANT: SHX6343

## 2022-01-05 HISTORY — DX: Generalized anxiety disorder: F41.1

## 2022-01-05 HISTORY — DX: Personal history of transient ischemic attack (TIA), and cerebral infarction without residual deficits: Z86.73

## 2022-01-05 HISTORY — DX: Benign prostatic hyperplasia with lower urinary tract symptoms: N40.1

## 2022-01-05 HISTORY — DX: Other specified postprocedural states: Z98.890

## 2022-01-05 HISTORY — PX: SPACE OAR INSTILLATION: SHX6769

## 2022-01-05 HISTORY — DX: Hyperlipidemia, unspecified: E78.5

## 2022-01-05 HISTORY — DX: Major depressive disorder, single episode, unspecified: F32.9

## 2022-01-05 SURGERY — INSERTION, GOLD SEEDS
Anesthesia: Monitor Anesthesia Care | Site: Prostate

## 2022-01-05 MED ORDER — OXYCODONE HCL 5 MG PO TABS: 5.0000 mg | ORAL_TABLET | Freq: Once | ORAL | Status: DC | PRN

## 2022-01-05 MED ORDER — HYDROMORPHONE HCL 1 MG/ML IJ SOLN: 0.2500 mg | INTRAMUSCULAR | Status: DC | PRN

## 2022-01-05 MED ORDER — KETAMINE HCL 50 MG/5ML IJ SOSY
PREFILLED_SYRINGE | INTRAMUSCULAR | Status: AC
  Filled 2022-01-05: qty 5

## 2022-01-05 MED ORDER — PROPOFOL 500 MG/50ML IV EMUL
INTRAVENOUS | Status: AC
  Filled 2022-01-05: qty 50

## 2022-01-05 MED ORDER — PROPOFOL 500 MG/50ML IV EMUL
INTRAVENOUS | Status: DC | PRN
  Administered 2022-01-05: 200 ug/kg/min via INTRAVENOUS

## 2022-01-05 MED ORDER — SODIUM CHLORIDE (PF) 0.9 % IJ SOLN
INTRAMUSCULAR | Status: DC | PRN
  Administered 2022-01-05: 10 mL via INTRAVENOUS

## 2022-01-05 MED ORDER — PROPOFOL 10 MG/ML IV BOLUS
INTRAVENOUS | Status: DC | PRN
  Administered 2022-01-05: 30 mg via INTRAVENOUS

## 2022-01-05 MED ORDER — CEFAZOLIN SODIUM-DEXTROSE 2-4 GM/100ML-% IV SOLN
2.0000 g | INTRAVENOUS | Status: AC
  Administered 2022-01-05: 2 g via INTRAVENOUS

## 2022-01-05 MED ORDER — TRAMADOL HCL 50 MG PO TABS: 50.0000 mg | ORAL_TABLET | Freq: Four times a day (QID) | ORAL | 0 refills | Status: DC | PRN

## 2022-01-05 MED ORDER — PROMETHAZINE HCL 25 MG/ML IJ SOLN: 6.2500 mg | INTRAMUSCULAR | Status: DC | PRN

## 2022-01-05 MED ORDER — FLEET ENEMA 7-19 GM/118ML RE ENEM: 1.0000 | ENEMA | Freq: Once | RECTAL | Status: DC

## 2022-01-05 MED ORDER — LACTATED RINGERS IV SOLN: INTRAVENOUS | Status: DC

## 2022-01-05 MED ORDER — KETAMINE HCL 10 MG/ML IJ SOLN
INTRAMUSCULAR | Status: DC | PRN
  Administered 2022-01-05 (×2): 10 mg via INTRAVENOUS

## 2022-01-05 MED ORDER — TAMSULOSIN HCL 0.4 MG PO CAPS: 0.4000 mg | ORAL_CAPSULE | Freq: Every day | ORAL | 11 refills | Status: AC | PRN

## 2022-01-05 MED ORDER — CEFAZOLIN SODIUM-DEXTROSE 2-4 GM/100ML-% IV SOLN
INTRAVENOUS | Status: AC
  Filled 2022-01-05: qty 100

## 2022-01-05 MED ORDER — OXYCODONE HCL 5 MG/5ML PO SOLN: 5.0000 mg | Freq: Once | ORAL | Status: DC | PRN

## 2022-01-05 MED ORDER — LIDOCAINE HCL 2 % IJ SOLN
INTRAMUSCULAR | Status: DC | PRN
  Administered 2022-01-05: 10 mL

## 2022-01-05 SURGICAL SUPPLY — 23 items
BLADE CLIPPER SENSICLIP SURGIC (BLADE) ×1 IMPLANT
CNTNR URN SCR LID CUP LEK RST (MISCELLANEOUS) ×1 IMPLANT
CONT SPEC 4OZ STRL OR WHT (MISCELLANEOUS) ×1
COVER BACK TABLE 60X90IN (DRAPES) ×1 IMPLANT
DRSG TEGADERM 4X4.75 (GAUZE/BANDAGES/DRESSINGS) ×1 IMPLANT
DRSG TEGADERM 8X12 (GAUZE/BANDAGES/DRESSINGS) ×1 IMPLANT
GLOVE BIO SURGEON STRL SZ7.5 (GLOVE) ×1 IMPLANT
GLOVE SURG SS PI 7.5 STRL IVOR (GLOVE) IMPLANT
IMPL SPACEOAR VUE SYSTEM (Spacer) ×1 IMPLANT
IMPLANT SPACEOAR VUE SYSTEM (Spacer) ×1 IMPLANT
KIT TURNOVER CYSTO (KITS) ×1 IMPLANT
MARKER GOLD PRELOAD 1.2X3 (Urological Implant) ×1 IMPLANT
MARKER SKIN DUAL TIP RULER LAB (MISCELLANEOUS) ×1 IMPLANT
NDL SPNL 22GX3.5 QUINCKE BK (NEEDLE) ×1 IMPLANT
NEEDLE SPNL 22GX3.5 QUINCKE BK (NEEDLE) ×1 IMPLANT
SEED GOLD PRELOAD 1.2X3 (Urological Implant) ×1 IMPLANT
SHEATH ULTRASOUND LTX NONSTRL (SHEATH) IMPLANT
SPIKE FLUID TRANSFER (MISCELLANEOUS) IMPLANT
SURGILUBE 2OZ TUBE FLIPTOP (MISCELLANEOUS) ×1 IMPLANT
SYR 10ML LL (SYRINGE) IMPLANT
SYR CONTROL 10ML LL (SYRINGE) ×1 IMPLANT
TOWEL OR 17X26 10 PK STRL BLUE (TOWEL DISPOSABLE) ×1 IMPLANT
UNDERPAD 30X36 HEAVY ABSORB (UNDERPADS AND DIAPERS) ×1 IMPLANT

## 2022-01-05 NOTE — Discharge Instructions (Addendum)
1 - You may have urinary urgency (bladder spasms) and bloody urine on / off while going through radiation. This is normal.  2 - Call MD or go to ER for fever >102, severe pain / nausea / vomiting not relieved by medications, inability to urinate, or acute change in medical status        Post Anesthesia Home Care Instructions  Activity: Get plenty of rest for the remainder of the day. A responsible individual must stay with you for 24 hours following the procedure.  For the next 24 hours, DO NOT: -Drive a car -Paediatric nurse -Drink alcoholic beverages -Take any medication unless instructed by your physician -Make any legal decisions or sign important papers.  Meals: Start with liquid foods such as gelatin or soup. Progress to regular foods as tolerated. Avoid greasy, spicy, heavy foods. If nausea and/or vomiting occur, drink only clear liquids until the nausea and/or vomiting subsides. Call your physician if vomiting continues.  Special Instructions/Symptoms: Your throat may feel dry or sore from the anesthesia or the breathing tube placed in your throat during surgery. If this causes discomfort, gargle with warm salt water. The discomfort should disappear within 24 hours.

## 2022-01-05 NOTE — Anesthesia Preprocedure Evaluation (Signed)
Anesthesia Evaluation  Patient identified by MRN, date of birth, ID band Patient awake    Reviewed: Allergy & Precautions, H&P , NPO status , Patient's Chart, lab work & pertinent test results  Airway Mallampati: II  TM Distance: >3 FB Neck ROM: Full    Dental no notable dental hx.    Pulmonary neg pulmonary ROS, former smoker   Pulmonary exam normal breath sounds clear to auscultation       Cardiovascular negative cardio ROS Normal cardiovascular exam Rhythm:Regular Rate:Normal     Neuro/Psych   Anxiety Depression    negative neurological ROS  negative psych ROS   GI/Hepatic negative GI ROS, Neg liver ROS,,,  Endo/Other  negative endocrine ROS    Renal/GU negative Renal ROS  negative genitourinary   Musculoskeletal negative musculoskeletal ROS (+)    Abdominal   Peds negative pediatric ROS (+)  Hematology negative hematology ROS (+)   Anesthesia Other Findings Prostate Cancer  Reproductive/Obstetrics negative OB ROS                             Anesthesia Physical Anesthesia Plan  ASA: 3  Anesthesia Plan: MAC   Post-op Pain Management: Minimal or no pain anticipated   Induction: Intravenous  PONV Risk Score and Plan: 1 and Ondansetron and Treatment may vary due to age or medical condition  Airway Management Planned: Simple Face Mask  Additional Equipment:   Intra-op Plan:   Post-operative Plan:   Informed Consent: I have reviewed the patients History and Physical, chart, labs and discussed the procedure including the risks, benefits and alternatives for the proposed anesthesia with the patient or authorized representative who has indicated his/her understanding and acceptance.     Dental advisory given  Plan Discussed with: CRNA  Anesthesia Plan Comments:        Anesthesia Quick Evaluation

## 2022-01-05 NOTE — Transfer of Care (Signed)
Immediate Anesthesia Transfer of Care Note  Patient: Oscar Haley  Procedure(s) Performed: Procedure(s) (LRB): GOLD SEED IMPLANT (N/A) SPACE OAR INSTILLATION (N/A)  Patient Location: PACU  Anesthesia Type: MAC  Level of Consciousness: awake, alert , oriented and patient cooperative  Airway & Oxygen Therapy: Patient Spontanous Breathing and Patient connected to nasal cannula  Post-op Assessment: Report given to PACU RN and Post -op Vital signs reviewed and stable  Post vital signs: Reviewed and stable  Complications: No apparent anesthesia complications Last Vitals:  Vitals Value Taken Time  BP 141/77 01/05/22 0838  Temp    Pulse 64 01/05/22 0839  Resp 19 01/05/22 0839  SpO2 98 % 01/05/22 0839  Vitals shown include unvalidated device data.  Last Pain:  Vitals:   01/05/22 0641  TempSrc: Oral  PainSc: 0-No pain      Patients Stated Pain Goal: 5 (42/87/68 1157)  Complications: No notable events documented.

## 2022-01-05 NOTE — Op Note (Signed)
NAMEMaximos Haley, [place] MEDICAL RECORD NO: [id number] ACCOUNT NO: [id number] DATE OF BIRTH: [birth year] FACILITY: [facility] LOCATION: [facility] PHYSICIAN: Alexis Frock, MD  Operative Report   DATE OF PROCEDURE: 01/05/2022  PREOPERATIVE DIAGNOSIS:  Moderate risk prostate cancer.  PROCEDURE: 1.  Prostate fiducial marker placement with transrectal ultrasound. 2.  SpaceOAR prerectal gel matrix placement.  ESTIMATED BLOOD LOSS:  Nil.  COMPLICATIONS:  None.  SPECIMEN:  None.  FINDINGS:  Excellent placement of fiducial markers and SpaceOAR.  INDICATIONS:  The patient is a 71 year old man with a history of moderate risk adenocarcinoma of the prostate, found on evaluation to have rising PSA to 11.8.  Options were discussed for management including continued surveillance protocols versus  ablative therapies versus surgical extirpation and he has decided to proceed with primary radiation as part of this. Radiation oncology team has requested fiducial marker and SpaceOAR placement to aid in targeting of radiation and to minimize GI side  effects.  Informed consent was obtained and placed in medical record.  PROCEDURE IN DETAIL:  The patient being Oscar Haley was verified, procedure being prostate fiducial markers and SpaceOAR placement was confirmed.  Procedure timeout was performed.  Intravenous antibiotics were administered. Monitored anesthesia care  conscious sedation was administered.  The patient was placed into medium lithotomy position.  Sterile field was created by first clipper shaving, then prepping and draping the patient's perineum.  His penis and scrotum were tucked out of the way using  extra large Tegaderm.  Transrectal ultrasound probe was placed per rectum.  Biplanar imaging was performed, which showed unremarkable prostate, seminal vesicles and bladder. Next, 5 mL of 2% lidocaine was placed in the subcutaneous wheal and within the  subcutaneous tissue towards the area of the apex  of the prostate.  Next, an additional 10 mL was placed directly into the apex of the prostate following a prostate block under transrectal ultrasound.  Next, fiducial markers were placed at the right mid  base, right mid apex, left mid lateral positions.  The ultrasound probe was taken off the anterior tension and using the SpaceOAR finder needle with transperineal approach, needle was advanced to the mid gland within the prerectal fat stripe.  This  corroborated by 2 mL of hydrodissection with normal saline.  Next, 10 mL of gel matrix was then instilled over 12 seconds as per manufacturer's guidelines resulted in excellent posterior displacement of anterior rectal wall.  Procedure was terminated.   The patient tolerated the procedure well, no immediate periprocedural complications.  The patient was taken to postanesthesia care unit in stable condition with plan for discharge home.   NIK D: 01/05/2022 8:36:56 am T: 01/05/2022 9:29:00 am  JOB: [id number]/ [id number]

## 2022-01-05 NOTE — H&P (Signed)
Oscar Haley is an 71 y.o. male.    Chief Complaint: Pre-Op Prostate Fiducial Marker Placement and SPACE-OAR  HPI:   1 - Moderate Risk Prostate Cancer - 4/12 up to 30% grade 3 cancer on BX 11/2021 on eval PSA 11.8. TRUS 35gm. Elects primary radiation.   Today "Oscar Haley" is seen to proceed with prostate fiducial marker placement and SPACE-OAR as part of primary prostate radiation.   Past Medical History:  Diagnosis Date   BPH associated with nocturia    GAD (generalized anxiety disorder)    History of squamous cell carcinoma excision    History of TIA (transient ischemic attack)    neruologist--- dr Tomi Likens;  lov note in epic 03-01-2021 seen for disequilibrium,  per MRI imaging small chronic left cerebellar infart white matter and mild cerebral atrophy   Hyperlipidemia    Malignant neoplasm prostate [facility]) 11/2021   urologist--- dr  eskridge/  radiation onologist--- dr Tammi Klippel;  dx 11/ 2023 Gleason 3+4   MDD (major depressive disorder)    Wears glasses     Past Surgical History:  Procedure Laterality Date   COLONOSCOPY  2009   approx   TONSILLECTOMY     age 26    History reviewed. No pertinent family history. Social History:  reports that he quit smoking about 39 years ago. His smoking use included cigarettes. He has never used smokeless tobacco. He reports current alcohol use. He reports that he does not use drugs.  Allergies: No Known Allergies  Medications Prior to Admission  Medication Sig Dispense Refill   Bioflavonoid Products (BIOFLEX) TABS Take 1 tablet by mouth 2 (two) times daily.     buPROPion (WELLBUTRIN XL) 300 MG 24 hr tablet Take 300 mg by mouth daily.     FLUoxetine (PROZAC) 40 MG capsule Take 40 mg by mouth daily.     hydrocortisone cream 1 % Apply 1 Application topically 2 (two) times daily as needed for itching.     rosuvastatin (CRESTOR) 10 MG tablet Take 10 mg by mouth at bedtime.      No results found for this or any previous visit (from the past 48  hour(s)). No results found.  Review of Systems  Constitutional:  Negative for chills and fever.  All other systems reviewed and are negative.   Height '5\' 3"'$  (1.6 m), weight 74.8 kg. Physical Exam Vitals reviewed.  HENT:     Head: Normocephalic.  Eyes:     Pupils: Pupils are equal, round, and reactive to light.  Cardiovascular:     Rate and Rhythm: Normal rate.  Pulmonary:     Effort: Pulmonary effort is normal.  Abdominal:     General: Abdomen is flat.  Genitourinary:    Comments: No CVAT at present Musculoskeletal:        General: Normal range of motion.     Cervical back: Normal range of motion.  Neurological:     General: No focal deficit present.  Psychiatric:        Mood and Affect: Mood normal.      Assessment/Plan  Proceed as planned with prostate fiducial marker and SPACE-OAR placement. Risks, benefits, alternatives, peri-procedure course discussed in detail.   Alexis Frock, MD 01/05/2022, 6:38 AM

## 2022-01-05 NOTE — Brief Op Note (Signed)
01/05/2022  8:33 AM  PATIENT:  Oscar Haley  71 y.o. male  PRE-OPERATIVE DIAGNOSIS:  PROSTATE CANCER  POST-OPERATIVE DIAGNOSIS:  * No post-op diagnosis entered *  PROCEDURE:  Procedure(s) with comments: GOLD SEED IMPLANT (N/A) SPACE OAR INSTILLATION (N/A) - 30 MINS FOR CASE  SURGEON:  Surgeon(s) and Role:    * Alexis Frock, MD - Primary  PHYSICIAN ASSISTANT:   ASSISTANTS: none   ANESTHESIA:   local and MAC  EBL:  minimal   BLOOD ADMINISTERED:none  DRAINS: none   LOCAL MEDICATIONS USED:  LIDOCAINE   SPECIMEN:  No Specimen  DISPOSITION OF SPECIMEN:  N/A  COUNTS:  YES  TOURNIQUET:  * No tourniquets in log *  DICTATION: .Other Dictation: Dictation Number [id number]  PLAN OF CARE: Discharge to home after PACU  PATIENT DISPOSITION:  PACU - hemodynamically stable.   Delay start of Pharmacological VTE agent (>24hrs) due to surgical blood loss or risk of bleeding: not applicable

## 2022-01-05 NOTE — Anesthesia Procedure Notes (Signed)
Procedure Name: MAC Date/Time: 01/05/2022 8:19 AM  Performed by: Suan Halter, CRNAPre-anesthesia Checklist: Patient identified, Emergency Drugs available, Suction available, Patient being monitored and Timeout performed Patient Re-evaluated:Patient Re-evaluated prior to induction Oxygen Delivery Method: Simple face mask

## 2022-01-05 NOTE — Anesthesia Postprocedure Evaluation (Signed)
Anesthesia Post Note  Patient: Oscar Haley  Procedure(s) Performed: GOLD SEED IMPLANT (Prostate) SPACE OAR INSTILLATION (Prostate)     Patient location during evaluation: PACU Anesthesia Type: MAC Level of consciousness: awake and alert Pain management: pain level controlled Vital Signs Assessment: post-procedure vital signs reviewed and stable Respiratory status: spontaneous breathing, nonlabored ventilation and respiratory function stable Cardiovascular status: blood pressure returned to baseline and stable Postop Assessment: no apparent nausea or vomiting Anesthetic complications: no   No notable events documented.  Last Vitals:  Vitals:   01/05/22 0845 01/05/22 0907  BP: (!) 146/80 (!) 157/85  Pulse: 67 60  Resp: 17 16  Temp:    SpO2: 96% 97%    Last Pain:  Vitals:   01/05/22 0837  TempSrc:   PainSc: 0-No pain                 Lynda Rainwater

## 2022-01-09 ENCOUNTER — Telehealth: Payer: Self-pay | Admitting: *Deleted

## 2022-01-09 NOTE — Telephone Encounter (Signed)
CALLED PATIENT TO REMIND OF SIM APPT. FOR 01-11-22- ARRIVAL TIME- 9:45 AM @ [facility], INFORMED PATIENT TO ARRIVE WITH A FULL BLADDER, LVM FOR A RETURN CALL

## 2022-01-10 ENCOUNTER — Encounter (HOSPITAL_BASED_OUTPATIENT_CLINIC_OR_DEPARTMENT_OTHER): Payer: Self-pay | Admitting: Urology

## 2022-01-10 NOTE — Progress Notes (Signed)
  Radiation Oncology         [phone]) [phone] ________________________________  Name: Oscar Haley MRN: [id number]  Date: 01/11/2022  DOB: 08/11/1950  SIMULATION AND TREATMENT PLANNING NOTE    ICD-10-CM   1. Malignant neoplasm of prostate ([place])  C61       DIAGNOSIS:  72 y.o. gentleman with Stage T2a adenocarcinoma of the prostate with Gleason score of 4+3, and PSA of 11.8.   NARRATIVE:  The patient was brought to the [facility].  Identity was confirmed.  All relevant records and images related to the planned course of therapy were reviewed.  The patient freely provided informed written consent to proceed with treatment after reviewing the details related to the planned course of therapy. The consent form was witnessed and verified by the simulation staff.  Then, the patient was set-up in a stable reproducible supine position for radiation therapy.  A vacuum lock pillow device was custom fabricated to position his legs in a reproducible immobilized position.  Then, I performed a urethrogram under sterile conditions to identify the prostatic apex.  CT images were obtained.  Surface markings were placed.  The CT images were loaded into the planning software.  Then the prostate target and avoidance structures including the rectum, bladder, bowel and hips were contoured.  Treatment planning then occurred.  The radiation prescription was entered and confirmed.  A total of one complex treatment devices was fabricated. I have requested : Intensity Modulated Radiotherapy (IMRT) is medically necessary for this case for the following reason:  Rectal sparing.Marland Kitchen  PLAN:  The patient will receive 70 Gy in 28 fractions.  ________________________________  Sheral Apley Tammi Klippel, M.D.

## 2022-01-11 ENCOUNTER — Ambulatory Visit
Admission: RE | Admit: 2022-01-11 | Discharge: 2022-01-11 | Disposition: A | Discharge status: Home / Self Care | Payer: Medicare HMO | Source: Ambulatory Visit | Attending: Radiation Oncology | Admitting: Radiation Oncology

## 2022-01-11 DIAGNOSIS — Z191 Hormone sensitive malignancy status: Secondary | ICD-10-CM | POA: Diagnosis not present

## 2022-01-11 DIAGNOSIS — C61 Malignant neoplasm of prostate: Secondary | ICD-10-CM | POA: Diagnosis not present

## 2022-01-11 DIAGNOSIS — Z510 Encounter for antineoplastic radiation therapy: Secondary | ICD-10-CM | POA: Insufficient documentation

## 2022-01-15 DIAGNOSIS — C61 Malignant neoplasm of prostate: Secondary | ICD-10-CM | POA: Diagnosis not present

## 2022-01-15 DIAGNOSIS — Z191 Hormone sensitive malignancy status: Secondary | ICD-10-CM | POA: Diagnosis not present

## 2022-01-15 DIAGNOSIS — Z510 Encounter for antineoplastic radiation therapy: Secondary | ICD-10-CM | POA: Diagnosis not present

## 2022-01-22 ENCOUNTER — Other Ambulatory Visit: Payer: Self-pay

## 2022-01-22 ENCOUNTER — Ambulatory Visit
Admission: RE | Admit: 2022-01-22 | Discharge: 2022-01-22 | Disposition: A | Discharge status: Home / Self Care | Payer: Medicare HMO | Source: Ambulatory Visit | Attending: Radiation Oncology | Admitting: Radiation Oncology

## 2022-01-22 DIAGNOSIS — C61 Malignant neoplasm of prostate: Secondary | ICD-10-CM | POA: Diagnosis not present

## 2022-01-22 DIAGNOSIS — Z191 Hormone sensitive malignancy status: Secondary | ICD-10-CM | POA: Diagnosis not present

## 2022-01-22 DIAGNOSIS — Z510 Encounter for antineoplastic radiation therapy: Secondary | ICD-10-CM | POA: Diagnosis not present

## 2022-01-22 LAB — RAD ONC ARIA SESSION SUMMARY
Course Elapsed Days: 0
Plan Fractions Treated to Date: 1
Plan Prescribed Dose Per Fraction: 2.5 Gy
Plan Total Fractions Prescribed: 28
Plan Total Prescribed Dose: 70 Gy
Reference Point Dosage Given to Date: 2.5 Gy
Reference Point Session Dosage Given: 2.5 Gy
Session Number: 1

## 2022-01-23 ENCOUNTER — Ambulatory Visit
Admission: RE | Admit: 2022-01-23 | Discharge: 2022-01-23 | Disposition: A | Discharge status: Home / Self Care | Payer: Medicare HMO | Source: Ambulatory Visit | Attending: Radiation Oncology | Admitting: Radiation Oncology

## 2022-01-23 ENCOUNTER — Other Ambulatory Visit: Payer: Self-pay

## 2022-01-23 DIAGNOSIS — C61 Malignant neoplasm of prostate: Secondary | ICD-10-CM | POA: Diagnosis not present

## 2022-01-23 DIAGNOSIS — Z191 Hormone sensitive malignancy status: Secondary | ICD-10-CM | POA: Diagnosis not present

## 2022-01-23 DIAGNOSIS — Z510 Encounter for antineoplastic radiation therapy: Secondary | ICD-10-CM | POA: Diagnosis not present

## 2022-01-23 LAB — RAD ONC ARIA SESSION SUMMARY
Course Elapsed Days: 1
Plan Fractions Treated to Date: 2
Plan Prescribed Dose Per Fraction: 2.5 Gy
Plan Total Fractions Prescribed: 28
Plan Total Prescribed Dose: 70 Gy
Reference Point Dosage Given to Date: 5 Gy
Reference Point Session Dosage Given: 2.5 Gy
Session Number: 2

## 2022-01-24 ENCOUNTER — Other Ambulatory Visit: Payer: Self-pay

## 2022-01-24 ENCOUNTER — Ambulatory Visit
Admission: RE | Admit: 2022-01-24 | Discharge: 2022-01-24 | Disposition: A | Discharge status: Home / Self Care | Payer: Medicare HMO | Source: Ambulatory Visit | Attending: Radiation Oncology | Admitting: Radiation Oncology

## 2022-01-24 DIAGNOSIS — C61 Malignant neoplasm of prostate: Secondary | ICD-10-CM | POA: Diagnosis not present

## 2022-01-24 DIAGNOSIS — Z191 Hormone sensitive malignancy status: Secondary | ICD-10-CM | POA: Diagnosis not present

## 2022-01-24 DIAGNOSIS — Z510 Encounter for antineoplastic radiation therapy: Secondary | ICD-10-CM | POA: Diagnosis not present

## 2022-01-24 LAB — RAD ONC ARIA SESSION SUMMARY
Course Elapsed Days: 2
Plan Fractions Treated to Date: 3
Plan Prescribed Dose Per Fraction: 2.5 Gy
Plan Total Fractions Prescribed: 28
Plan Total Prescribed Dose: 70 Gy
Reference Point Dosage Given to Date: 7.5 Gy
Reference Point Session Dosage Given: 2.5 Gy
Session Number: 3

## 2022-01-25 ENCOUNTER — Ambulatory Visit
Admission: RE | Admit: 2022-01-25 | Discharge: 2022-01-25 | Disposition: A | Discharge status: Home / Self Care | Payer: Medicare HMO | Source: Ambulatory Visit | Attending: Radiation Oncology | Admitting: Radiation Oncology

## 2022-01-25 ENCOUNTER — Other Ambulatory Visit: Payer: Self-pay

## 2022-01-25 ENCOUNTER — Ambulatory Visit: Payer: Medicare HMO

## 2022-01-25 DIAGNOSIS — Z191 Hormone sensitive malignancy status: Secondary | ICD-10-CM | POA: Diagnosis not present

## 2022-01-25 DIAGNOSIS — Z510 Encounter for antineoplastic radiation therapy: Secondary | ICD-10-CM | POA: Diagnosis not present

## 2022-01-25 DIAGNOSIS — C61 Malignant neoplasm of prostate: Secondary | ICD-10-CM | POA: Diagnosis not present

## 2022-01-25 LAB — RAD ONC ARIA SESSION SUMMARY
Course Elapsed Days: 3
Plan Fractions Treated to Date: 4
Plan Prescribed Dose Per Fraction: 2.5 Gy
Plan Total Fractions Prescribed: 28
Plan Total Prescribed Dose: 70 Gy
Reference Point Dosage Given to Date: 10 Gy
Reference Point Session Dosage Given: 2.5 Gy
Session Number: 4

## 2022-01-26 ENCOUNTER — Other Ambulatory Visit: Payer: Self-pay

## 2022-01-26 ENCOUNTER — Ambulatory Visit
Admission: RE | Admit: 2022-01-26 | Discharge: 2022-01-26 | Disposition: A | Discharge status: Home / Self Care | Payer: Medicare HMO | Source: Ambulatory Visit | Attending: Radiation Oncology | Admitting: Radiation Oncology

## 2022-01-26 DIAGNOSIS — Z191 Hormone sensitive malignancy status: Secondary | ICD-10-CM | POA: Diagnosis not present

## 2022-01-26 DIAGNOSIS — C61 Malignant neoplasm of prostate: Secondary | ICD-10-CM | POA: Diagnosis not present

## 2022-01-26 DIAGNOSIS — Z510 Encounter for antineoplastic radiation therapy: Secondary | ICD-10-CM | POA: Diagnosis not present

## 2022-01-26 LAB — RAD ONC ARIA SESSION SUMMARY
Course Elapsed Days: 4
Plan Fractions Treated to Date: 5
Plan Prescribed Dose Per Fraction: 2.5 Gy
Plan Total Fractions Prescribed: 28
Plan Total Prescribed Dose: 70 Gy
Reference Point Dosage Given to Date: 12.5 Gy
Reference Point Session Dosage Given: 2.5 Gy
Session Number: 5

## 2022-01-29 ENCOUNTER — Other Ambulatory Visit: Payer: Self-pay

## 2022-01-29 ENCOUNTER — Ambulatory Visit
Admission: RE | Admit: 2022-01-29 | Discharge: 2022-01-29 | Disposition: A | Discharge status: Home / Self Care | Payer: Medicare HMO | Source: Ambulatory Visit | Attending: Radiation Oncology | Admitting: Radiation Oncology

## 2022-01-29 DIAGNOSIS — Z510 Encounter for antineoplastic radiation therapy: Secondary | ICD-10-CM | POA: Diagnosis not present

## 2022-01-29 DIAGNOSIS — C61 Malignant neoplasm of prostate: Secondary | ICD-10-CM | POA: Diagnosis not present

## 2022-01-29 DIAGNOSIS — Z191 Hormone sensitive malignancy status: Secondary | ICD-10-CM | POA: Diagnosis not present

## 2022-01-29 LAB — RAD ONC ARIA SESSION SUMMARY
Course Elapsed Days: 7
Plan Fractions Treated to Date: 6
Plan Prescribed Dose Per Fraction: 2.5 Gy
Plan Total Fractions Prescribed: 28
Plan Total Prescribed Dose: 70 Gy
Reference Point Dosage Given to Date: 15 Gy
Reference Point Session Dosage Given: 2.5 Gy
Session Number: 6

## 2022-01-30 ENCOUNTER — Other Ambulatory Visit: Payer: Self-pay

## 2022-01-30 ENCOUNTER — Ambulatory Visit
Admission: RE | Admit: 2022-01-30 | Discharge: 2022-01-30 | Disposition: A | Discharge status: Home / Self Care | Payer: Medicare HMO | Source: Ambulatory Visit | Attending: Radiation Oncology | Admitting: Radiation Oncology

## 2022-01-30 DIAGNOSIS — Z191 Hormone sensitive malignancy status: Secondary | ICD-10-CM | POA: Diagnosis not present

## 2022-01-30 DIAGNOSIS — C61 Malignant neoplasm of prostate: Secondary | ICD-10-CM | POA: Diagnosis not present

## 2022-01-30 DIAGNOSIS — Z510 Encounter for antineoplastic radiation therapy: Secondary | ICD-10-CM | POA: Diagnosis not present

## 2022-01-30 LAB — RAD ONC ARIA SESSION SUMMARY
Course Elapsed Days: 8
Plan Fractions Treated to Date: 7
Plan Prescribed Dose Per Fraction: 2.5 Gy
Plan Total Fractions Prescribed: 28
Plan Total Prescribed Dose: 70 Gy
Reference Point Dosage Given to Date: 17.5 Gy
Reference Point Session Dosage Given: 2.5 Gy
Session Number: 7

## 2022-01-31 ENCOUNTER — Ambulatory Visit
Admission: RE | Admit: 2022-01-31 | Discharge: 2022-01-31 | Disposition: A | Discharge status: Home / Self Care | Payer: Medicare HMO | Source: Ambulatory Visit | Attending: Radiation Oncology | Admitting: Radiation Oncology

## 2022-01-31 ENCOUNTER — Other Ambulatory Visit: Payer: Self-pay

## 2022-01-31 DIAGNOSIS — Z191 Hormone sensitive malignancy status: Secondary | ICD-10-CM | POA: Diagnosis not present

## 2022-01-31 DIAGNOSIS — C61 Malignant neoplasm of prostate: Secondary | ICD-10-CM | POA: Diagnosis not present

## 2022-01-31 DIAGNOSIS — Z510 Encounter for antineoplastic radiation therapy: Secondary | ICD-10-CM | POA: Diagnosis not present

## 2022-01-31 LAB — RAD ONC ARIA SESSION SUMMARY
Course Elapsed Days: 9
Plan Fractions Treated to Date: 8
Plan Prescribed Dose Per Fraction: 2.5 Gy
Plan Total Fractions Prescribed: 28
Plan Total Prescribed Dose: 70 Gy
Reference Point Dosage Given to Date: 20 Gy
Reference Point Session Dosage Given: 2.5 Gy
Session Number: 8

## 2022-02-01 ENCOUNTER — Ambulatory Visit
Admission: RE | Admit: 2022-02-01 | Discharge: 2022-02-01 | Disposition: A | Discharge status: Home / Self Care | Payer: Medicare HMO | Source: Ambulatory Visit | Attending: Radiation Oncology | Admitting: Radiation Oncology

## 2022-02-01 ENCOUNTER — Other Ambulatory Visit: Payer: Self-pay

## 2022-02-01 DIAGNOSIS — C61 Malignant neoplasm of prostate: Secondary | ICD-10-CM | POA: Diagnosis not present

## 2022-02-01 DIAGNOSIS — Z191 Hormone sensitive malignancy status: Secondary | ICD-10-CM | POA: Diagnosis not present

## 2022-02-01 DIAGNOSIS — Z510 Encounter for antineoplastic radiation therapy: Secondary | ICD-10-CM | POA: Diagnosis not present

## 2022-02-01 LAB — RAD ONC ARIA SESSION SUMMARY
Course Elapsed Days: 10
Plan Fractions Treated to Date: 9
Plan Prescribed Dose Per Fraction: 2.5 Gy
Plan Total Fractions Prescribed: 28
Plan Total Prescribed Dose: 70 Gy
Reference Point Dosage Given to Date: 22.5 Gy
Reference Point Session Dosage Given: 2.5 Gy
Session Number: 9

## 2022-02-02 ENCOUNTER — Ambulatory Visit
Admission: RE | Admit: 2022-02-02 | Discharge: 2022-02-02 | Disposition: A | Discharge status: Home / Self Care | Payer: Medicare HMO | Source: Ambulatory Visit | Attending: Radiation Oncology | Admitting: Radiation Oncology

## 2022-02-02 ENCOUNTER — Other Ambulatory Visit: Payer: Self-pay

## 2022-02-02 DIAGNOSIS — Z191 Hormone sensitive malignancy status: Secondary | ICD-10-CM | POA: Diagnosis not present

## 2022-02-02 DIAGNOSIS — C61 Malignant neoplasm of prostate: Secondary | ICD-10-CM | POA: Diagnosis not present

## 2022-02-02 DIAGNOSIS — Z510 Encounter for antineoplastic radiation therapy: Secondary | ICD-10-CM | POA: Diagnosis not present

## 2022-02-02 LAB — RAD ONC ARIA SESSION SUMMARY
Course Elapsed Days: 11
Plan Fractions Treated to Date: 10
Plan Prescribed Dose Per Fraction: 2.5 Gy
Plan Total Fractions Prescribed: 28
Plan Total Prescribed Dose: 70 Gy
Reference Point Dosage Given to Date: 25 Gy
Reference Point Session Dosage Given: 2.5 Gy
Session Number: 10

## 2022-02-05 ENCOUNTER — Other Ambulatory Visit: Payer: Self-pay

## 2022-02-05 ENCOUNTER — Ambulatory Visit
Admission: RE | Admit: 2022-02-05 | Discharge: 2022-02-05 | Disposition: A | Discharge status: Home / Self Care | Payer: Medicare HMO | Source: Ambulatory Visit | Attending: Radiation Oncology | Admitting: Radiation Oncology

## 2022-02-05 DIAGNOSIS — Z510 Encounter for antineoplastic radiation therapy: Secondary | ICD-10-CM | POA: Diagnosis not present

## 2022-02-05 DIAGNOSIS — C61 Malignant neoplasm of prostate: Secondary | ICD-10-CM | POA: Diagnosis not present

## 2022-02-05 DIAGNOSIS — Z191 Hormone sensitive malignancy status: Secondary | ICD-10-CM | POA: Diagnosis not present

## 2022-02-05 LAB — RAD ONC ARIA SESSION SUMMARY
Course Elapsed Days: 14
Plan Fractions Treated to Date: 11
Plan Prescribed Dose Per Fraction: 2.5 Gy
Plan Total Fractions Prescribed: 28
Plan Total Prescribed Dose: 70 Gy
Reference Point Dosage Given to Date: 27.5 Gy
Reference Point Session Dosage Given: 2.5 Gy
Session Number: 11

## 2022-02-06 ENCOUNTER — Telehealth: Payer: Self-pay

## 2022-02-06 ENCOUNTER — Other Ambulatory Visit: Payer: Self-pay

## 2022-02-06 ENCOUNTER — Ambulatory Visit
Admission: RE | Admit: 2022-02-06 | Discharge: 2022-02-06 | Disposition: A | Discharge status: Home / Self Care | Payer: Medicare HMO | Source: Ambulatory Visit | Attending: Radiation Oncology | Admitting: Radiation Oncology

## 2022-02-06 DIAGNOSIS — C61 Malignant neoplasm of prostate: Secondary | ICD-10-CM | POA: Diagnosis not present

## 2022-02-06 DIAGNOSIS — Z191 Hormone sensitive malignancy status: Secondary | ICD-10-CM | POA: Diagnosis not present

## 2022-02-06 DIAGNOSIS — Z510 Encounter for antineoplastic radiation therapy: Secondary | ICD-10-CM | POA: Diagnosis not present

## 2022-02-06 LAB — RAD ONC ARIA SESSION SUMMARY
Course Elapsed Days: 15
Plan Fractions Treated to Date: 12
Plan Prescribed Dose Per Fraction: 2.5 Gy
Plan Total Fractions Prescribed: 28
Plan Total Prescribed Dose: 70 Gy
Reference Point Dosage Given to Date: 30 Gy
Reference Point Session Dosage Given: 2.5 Gy
Session Number: 12

## 2022-02-06 NOTE — Telephone Encounter (Signed)
RN called patient to follow up on concerns of bleeding hemorrhoids x 1 week  Patient said that he had hemorrhoids before and feels like it could be for when had seed implant.  Reports After confirming identity Mr. Coberly and wife in background discussed concerns.  Wife reports it looks more like a laceration rather then hemorrhoids bleeds when wiping with toilet paper wants someone to take a look and discuss treatment options.  Location between anus and scrotum.  Mrs. Sollenberger and patient want to make sure is not radiation related.  Mr. Crymes stated he would have his wife take a picture and when he comes in for treatment this morning at 10 am will show nurse.  Until then RN told patient to use moist wipes and Sitz bath for comfort.  Both patient and wife verbalized understanding and appreciated the call.  Will report finding to [employer], PA-C.

## 2022-02-07 ENCOUNTER — Other Ambulatory Visit: Payer: Self-pay

## 2022-02-07 ENCOUNTER — Ambulatory Visit
Admission: RE | Admit: 2022-02-07 | Discharge: 2022-02-07 | Disposition: A | Discharge status: Home / Self Care | Payer: Medicare HMO | Source: Ambulatory Visit | Attending: Radiation Oncology | Admitting: Radiation Oncology

## 2022-02-07 DIAGNOSIS — Z191 Hormone sensitive malignancy status: Secondary | ICD-10-CM | POA: Diagnosis not present

## 2022-02-07 DIAGNOSIS — C61 Malignant neoplasm of prostate: Secondary | ICD-10-CM | POA: Diagnosis not present

## 2022-02-07 DIAGNOSIS — Z510 Encounter for antineoplastic radiation therapy: Secondary | ICD-10-CM | POA: Diagnosis not present

## 2022-02-07 LAB — RAD ONC ARIA SESSION SUMMARY
Course Elapsed Days: 16
Plan Fractions Treated to Date: 13
Plan Prescribed Dose Per Fraction: 2.5 Gy
Plan Total Fractions Prescribed: 28
Plan Total Prescribed Dose: 70 Gy
Reference Point Dosage Given to Date: 32.5 Gy
Reference Point Session Dosage Given: 2.5 Gy
Session Number: 13

## 2022-02-08 ENCOUNTER — Ambulatory Visit
Admission: RE | Admit: 2022-02-08 | Discharge: 2022-02-08 | Disposition: A | Discharge status: Home / Self Care | Payer: Medicare HMO | Source: Ambulatory Visit | Attending: Radiation Oncology | Admitting: Radiation Oncology

## 2022-02-08 ENCOUNTER — Other Ambulatory Visit: Payer: Self-pay

## 2022-02-08 DIAGNOSIS — Z510 Encounter for antineoplastic radiation therapy: Secondary | ICD-10-CM | POA: Diagnosis not present

## 2022-02-08 DIAGNOSIS — C61 Malignant neoplasm of prostate: Secondary | ICD-10-CM | POA: Insufficient documentation

## 2022-02-08 DIAGNOSIS — Z191 Hormone sensitive malignancy status: Secondary | ICD-10-CM | POA: Diagnosis not present

## 2022-02-08 LAB — RAD ONC ARIA SESSION SUMMARY
Course Elapsed Days: 17
Plan Fractions Treated to Date: 14
Plan Prescribed Dose Per Fraction: 2.5 Gy
Plan Total Fractions Prescribed: 28
Plan Total Prescribed Dose: 70 Gy
Reference Point Dosage Given to Date: 35 Gy
Reference Point Session Dosage Given: 2.5 Gy
Session Number: 14

## 2022-02-09 ENCOUNTER — Other Ambulatory Visit: Payer: Self-pay

## 2022-02-09 ENCOUNTER — Ambulatory Visit
Admission: RE | Admit: 2022-02-09 | Discharge: 2022-02-09 | Disposition: A | Discharge status: Home / Self Care | Payer: Medicare HMO | Source: Ambulatory Visit | Attending: Radiation Oncology | Admitting: Radiation Oncology

## 2022-02-09 DIAGNOSIS — Z510 Encounter for antineoplastic radiation therapy: Secondary | ICD-10-CM | POA: Diagnosis not present

## 2022-02-09 DIAGNOSIS — C61 Malignant neoplasm of prostate: Secondary | ICD-10-CM | POA: Diagnosis not present

## 2022-02-09 DIAGNOSIS — Z191 Hormone sensitive malignancy status: Secondary | ICD-10-CM | POA: Diagnosis not present

## 2022-02-09 LAB — RAD ONC ARIA SESSION SUMMARY
Course Elapsed Days: 18
Plan Fractions Treated to Date: 15
Plan Prescribed Dose Per Fraction: 2.5 Gy
Plan Total Fractions Prescribed: 28
Plan Total Prescribed Dose: 70 Gy
Reference Point Dosage Given to Date: 37.5 Gy
Reference Point Session Dosage Given: 2.5 Gy
Session Number: 15

## 2022-02-12 ENCOUNTER — Other Ambulatory Visit: Payer: Self-pay

## 2022-02-12 ENCOUNTER — Ambulatory Visit
Admission: RE | Admit: 2022-02-12 | Discharge: 2022-02-12 | Disposition: A | Discharge status: Home / Self Care | Payer: Medicare HMO | Source: Ambulatory Visit | Attending: Radiation Oncology | Admitting: Radiation Oncology

## 2022-02-12 DIAGNOSIS — Z510 Encounter for antineoplastic radiation therapy: Secondary | ICD-10-CM | POA: Diagnosis not present

## 2022-02-12 DIAGNOSIS — C61 Malignant neoplasm of prostate: Secondary | ICD-10-CM | POA: Diagnosis not present

## 2022-02-12 DIAGNOSIS — Z191 Hormone sensitive malignancy status: Secondary | ICD-10-CM | POA: Diagnosis not present

## 2022-02-12 LAB — RAD ONC ARIA SESSION SUMMARY
Course Elapsed Days: 21
Plan Fractions Treated to Date: 16
Plan Prescribed Dose Per Fraction: 2.5 Gy
Plan Total Fractions Prescribed: 28
Plan Total Prescribed Dose: 70 Gy
Reference Point Dosage Given to Date: 40 Gy
Reference Point Session Dosage Given: 2.5 Gy
Session Number: 16

## 2022-02-13 ENCOUNTER — Ambulatory Visit
Admission: RE | Admit: 2022-02-13 | Discharge: 2022-02-13 | Disposition: A | Discharge status: Home / Self Care | Payer: Medicare HMO | Source: Ambulatory Visit | Attending: Radiation Oncology | Admitting: Radiation Oncology

## 2022-02-13 ENCOUNTER — Other Ambulatory Visit: Payer: Self-pay

## 2022-02-13 DIAGNOSIS — Z191 Hormone sensitive malignancy status: Secondary | ICD-10-CM | POA: Diagnosis not present

## 2022-02-13 DIAGNOSIS — Z510 Encounter for antineoplastic radiation therapy: Secondary | ICD-10-CM | POA: Diagnosis not present

## 2022-02-13 DIAGNOSIS — C61 Malignant neoplasm of prostate: Secondary | ICD-10-CM | POA: Diagnosis not present

## 2022-02-13 LAB — RAD ONC ARIA SESSION SUMMARY
Course Elapsed Days: 22
Plan Fractions Treated to Date: 17
Plan Prescribed Dose Per Fraction: 2.5 Gy
Plan Total Fractions Prescribed: 28
Plan Total Prescribed Dose: 70 Gy
Reference Point Dosage Given to Date: 42.5 Gy
Reference Point Session Dosage Given: 2.5 Gy
Session Number: 17

## 2022-02-14 ENCOUNTER — Other Ambulatory Visit: Payer: Self-pay

## 2022-02-14 ENCOUNTER — Ambulatory Visit
Admission: RE | Admit: 2022-02-14 | Discharge: 2022-02-14 | Disposition: A | Discharge status: Home / Self Care | Payer: Medicare HMO | Source: Ambulatory Visit | Attending: Radiation Oncology | Admitting: Radiation Oncology

## 2022-02-14 DIAGNOSIS — Z191 Hormone sensitive malignancy status: Secondary | ICD-10-CM | POA: Diagnosis not present

## 2022-02-14 DIAGNOSIS — C61 Malignant neoplasm of prostate: Secondary | ICD-10-CM | POA: Diagnosis not present

## 2022-02-14 DIAGNOSIS — Z510 Encounter for antineoplastic radiation therapy: Secondary | ICD-10-CM | POA: Diagnosis not present

## 2022-02-14 LAB — RAD ONC ARIA SESSION SUMMARY
Course Elapsed Days: 23
Plan Fractions Treated to Date: 18
Plan Prescribed Dose Per Fraction: 2.5 Gy
Plan Total Fractions Prescribed: 28
Plan Total Prescribed Dose: 70 Gy
Reference Point Dosage Given to Date: 45 Gy
Reference Point Session Dosage Given: 2.5 Gy
Session Number: 18

## 2022-02-15 ENCOUNTER — Other Ambulatory Visit: Payer: Self-pay

## 2022-02-15 ENCOUNTER — Ambulatory Visit
Admission: RE | Admit: 2022-02-15 | Discharge: 2022-02-15 | Disposition: A | Discharge status: Home / Self Care | Payer: Medicare HMO | Source: Ambulatory Visit | Attending: Radiation Oncology | Admitting: Radiation Oncology

## 2022-02-15 DIAGNOSIS — Z191 Hormone sensitive malignancy status: Secondary | ICD-10-CM | POA: Diagnosis not present

## 2022-02-15 DIAGNOSIS — Z510 Encounter for antineoplastic radiation therapy: Secondary | ICD-10-CM | POA: Diagnosis not present

## 2022-02-15 DIAGNOSIS — C61 Malignant neoplasm of prostate: Secondary | ICD-10-CM | POA: Diagnosis not present

## 2022-02-15 LAB — RAD ONC ARIA SESSION SUMMARY
Course Elapsed Days: 24
Plan Fractions Treated to Date: 19
Plan Prescribed Dose Per Fraction: 2.5 Gy
Plan Total Fractions Prescribed: 28
Plan Total Prescribed Dose: 70 Gy
Reference Point Dosage Given to Date: 47.5 Gy
Reference Point Session Dosage Given: 2.5 Gy
Session Number: 19

## 2022-02-16 ENCOUNTER — Ambulatory Visit
Admission: RE | Admit: 2022-02-16 | Discharge: 2022-02-16 | Disposition: A | Discharge status: Home / Self Care | Payer: Medicare HMO | Source: Ambulatory Visit | Attending: Radiation Oncology | Admitting: Radiation Oncology

## 2022-02-16 ENCOUNTER — Other Ambulatory Visit: Payer: Self-pay

## 2022-02-16 DIAGNOSIS — C61 Malignant neoplasm of prostate: Secondary | ICD-10-CM | POA: Diagnosis not present

## 2022-02-16 DIAGNOSIS — Z191 Hormone sensitive malignancy status: Secondary | ICD-10-CM | POA: Diagnosis not present

## 2022-02-16 DIAGNOSIS — Z510 Encounter for antineoplastic radiation therapy: Secondary | ICD-10-CM | POA: Diagnosis not present

## 2022-02-16 LAB — RAD ONC ARIA SESSION SUMMARY
Course Elapsed Days: 25
Plan Fractions Treated to Date: 20
Plan Prescribed Dose Per Fraction: 2.5 Gy
Plan Total Fractions Prescribed: 28
Plan Total Prescribed Dose: 70 Gy
Reference Point Dosage Given to Date: 50 Gy
Reference Point Session Dosage Given: 2.5 Gy
Session Number: 20

## 2022-02-19 ENCOUNTER — Other Ambulatory Visit: Payer: Self-pay

## 2022-02-19 ENCOUNTER — Ambulatory Visit
Admission: RE | Admit: 2022-02-19 | Discharge: 2022-02-19 | Disposition: A | Discharge status: Home / Self Care | Payer: Medicare HMO | Source: Ambulatory Visit | Attending: Radiation Oncology | Admitting: Radiation Oncology

## 2022-02-19 DIAGNOSIS — Z510 Encounter for antineoplastic radiation therapy: Secondary | ICD-10-CM | POA: Diagnosis not present

## 2022-02-19 DIAGNOSIS — C61 Malignant neoplasm of prostate: Secondary | ICD-10-CM | POA: Diagnosis not present

## 2022-02-19 DIAGNOSIS — Z191 Hormone sensitive malignancy status: Secondary | ICD-10-CM | POA: Diagnosis not present

## 2022-02-19 LAB — RAD ONC ARIA SESSION SUMMARY
Course Elapsed Days: 28
Plan Fractions Treated to Date: 21
Plan Prescribed Dose Per Fraction: 2.5 Gy
Plan Total Fractions Prescribed: 28
Plan Total Prescribed Dose: 70 Gy
Reference Point Dosage Given to Date: 52.5 Gy
Reference Point Session Dosage Given: 2.5 Gy
Session Number: 21

## 2022-02-20 ENCOUNTER — Ambulatory Visit
Admission: RE | Admit: 2022-02-20 | Discharge: 2022-02-20 | Disposition: A | Discharge status: Home / Self Care | Payer: Medicare HMO | Source: Ambulatory Visit | Attending: Radiation Oncology | Admitting: Radiation Oncology

## 2022-02-20 ENCOUNTER — Other Ambulatory Visit: Payer: Self-pay

## 2022-02-20 DIAGNOSIS — Z191 Hormone sensitive malignancy status: Secondary | ICD-10-CM | POA: Diagnosis not present

## 2022-02-20 DIAGNOSIS — Z510 Encounter for antineoplastic radiation therapy: Secondary | ICD-10-CM | POA: Diagnosis not present

## 2022-02-20 DIAGNOSIS — C61 Malignant neoplasm of prostate: Secondary | ICD-10-CM | POA: Diagnosis not present

## 2022-02-20 LAB — RAD ONC ARIA SESSION SUMMARY
Course Elapsed Days: 29
Plan Fractions Treated to Date: 22
Plan Prescribed Dose Per Fraction: 2.5 Gy
Plan Total Fractions Prescribed: 28
Plan Total Prescribed Dose: 70 Gy
Reference Point Dosage Given to Date: 55 Gy
Reference Point Session Dosage Given: 2.5 Gy
Session Number: 22

## 2022-02-21 ENCOUNTER — Other Ambulatory Visit: Payer: Self-pay

## 2022-02-21 ENCOUNTER — Ambulatory Visit
Admission: RE | Admit: 2022-02-21 | Discharge: 2022-02-21 | Disposition: A | Discharge status: Home / Self Care | Payer: Medicare HMO | Source: Ambulatory Visit | Attending: Radiation Oncology | Admitting: Radiation Oncology

## 2022-02-21 DIAGNOSIS — C61 Malignant neoplasm of prostate: Secondary | ICD-10-CM | POA: Diagnosis not present

## 2022-02-21 DIAGNOSIS — Z191 Hormone sensitive malignancy status: Secondary | ICD-10-CM | POA: Diagnosis not present

## 2022-02-21 DIAGNOSIS — Z510 Encounter for antineoplastic radiation therapy: Secondary | ICD-10-CM | POA: Diagnosis not present

## 2022-02-21 LAB — RAD ONC ARIA SESSION SUMMARY
Course Elapsed Days: 30
Plan Fractions Treated to Date: 23
Plan Prescribed Dose Per Fraction: 2.5 Gy
Plan Total Fractions Prescribed: 28
Plan Total Prescribed Dose: 70 Gy
Reference Point Dosage Given to Date: 57.5 Gy
Reference Point Session Dosage Given: 2.5 Gy
Session Number: 23

## 2022-02-22 ENCOUNTER — Ambulatory Visit
Admission: RE | Admit: 2022-02-22 | Discharge: 2022-02-22 | Disposition: A | Discharge status: Home / Self Care | Payer: Medicare HMO | Source: Ambulatory Visit | Attending: Radiation Oncology | Admitting: Radiation Oncology

## 2022-02-22 ENCOUNTER — Ambulatory Visit: Payer: Medicare HMO

## 2022-02-22 ENCOUNTER — Other Ambulatory Visit: Payer: Self-pay

## 2022-02-22 DIAGNOSIS — Z191 Hormone sensitive malignancy status: Secondary | ICD-10-CM | POA: Diagnosis not present

## 2022-02-22 DIAGNOSIS — Z510 Encounter for antineoplastic radiation therapy: Secondary | ICD-10-CM | POA: Diagnosis not present

## 2022-02-22 DIAGNOSIS — C61 Malignant neoplasm of prostate: Secondary | ICD-10-CM | POA: Diagnosis not present

## 2022-02-22 LAB — RAD ONC ARIA SESSION SUMMARY
Course Elapsed Days: 31
Plan Fractions Treated to Date: 24
Plan Prescribed Dose Per Fraction: 2.5 Gy
Plan Total Fractions Prescribed: 28
Plan Total Prescribed Dose: 70 Gy
Reference Point Dosage Given to Date: 60 Gy
Reference Point Session Dosage Given: 2.5 Gy
Session Number: 24

## 2022-02-23 ENCOUNTER — Ambulatory Visit
Admission: RE | Admit: 2022-02-23 | Discharge: 2022-02-23 | Disposition: A | Discharge status: Home / Self Care | Payer: Medicare HMO | Source: Ambulatory Visit | Attending: Radiation Oncology | Admitting: Radiation Oncology

## 2022-02-23 ENCOUNTER — Other Ambulatory Visit: Payer: Self-pay

## 2022-02-23 DIAGNOSIS — K629 Disease of anus and rectum, unspecified: Secondary | ICD-10-CM | POA: Diagnosis not present

## 2022-02-23 DIAGNOSIS — C61 Malignant neoplasm of prostate: Secondary | ICD-10-CM | POA: Diagnosis not present

## 2022-02-23 DIAGNOSIS — Z191 Hormone sensitive malignancy status: Secondary | ICD-10-CM | POA: Diagnosis not present

## 2022-02-23 DIAGNOSIS — Z510 Encounter for antineoplastic radiation therapy: Secondary | ICD-10-CM | POA: Diagnosis not present

## 2022-02-23 LAB — RAD ONC ARIA SESSION SUMMARY
Course Elapsed Days: 32
Plan Fractions Treated to Date: 25
Plan Prescribed Dose Per Fraction: 2.5 Gy
Plan Total Fractions Prescribed: 28
Plan Total Prescribed Dose: 70 Gy
Reference Point Dosage Given to Date: 62.5 Gy
Reference Point Session Dosage Given: 2.5 Gy
Session Number: 25

## 2022-02-26 ENCOUNTER — Ambulatory Visit
Admission: RE | Admit: 2022-02-26 | Discharge: 2022-02-26 | Disposition: A | Discharge status: Home / Self Care | Payer: Medicare HMO | Source: Ambulatory Visit | Attending: Radiation Oncology | Admitting: Radiation Oncology

## 2022-02-26 ENCOUNTER — Other Ambulatory Visit: Payer: Self-pay

## 2022-02-26 DIAGNOSIS — Z510 Encounter for antineoplastic radiation therapy: Secondary | ICD-10-CM | POA: Diagnosis not present

## 2022-02-26 DIAGNOSIS — C61 Malignant neoplasm of prostate: Secondary | ICD-10-CM | POA: Diagnosis not present

## 2022-02-26 DIAGNOSIS — Z191 Hormone sensitive malignancy status: Secondary | ICD-10-CM | POA: Diagnosis not present

## 2022-02-26 LAB — RAD ONC ARIA SESSION SUMMARY
Course Elapsed Days: 35
Plan Fractions Treated to Date: 26
Plan Prescribed Dose Per Fraction: 2.5 Gy
Plan Total Fractions Prescribed: 28
Plan Total Prescribed Dose: 70 Gy
Reference Point Dosage Given to Date: 65 Gy
Reference Point Session Dosage Given: 2.5 Gy
Session Number: 26

## 2022-02-27 ENCOUNTER — Other Ambulatory Visit: Payer: Self-pay

## 2022-02-27 ENCOUNTER — Ambulatory Visit
Admission: RE | Admit: 2022-02-27 | Discharge: 2022-02-27 | Disposition: A | Discharge status: Home / Self Care | Payer: Medicare HMO | Source: Ambulatory Visit | Attending: Radiation Oncology | Admitting: Radiation Oncology

## 2022-02-27 DIAGNOSIS — Z191 Hormone sensitive malignancy status: Secondary | ICD-10-CM | POA: Diagnosis not present

## 2022-02-27 DIAGNOSIS — Z510 Encounter for antineoplastic radiation therapy: Secondary | ICD-10-CM | POA: Diagnosis not present

## 2022-02-27 DIAGNOSIS — C61 Malignant neoplasm of prostate: Secondary | ICD-10-CM | POA: Diagnosis not present

## 2022-02-27 LAB — RAD ONC ARIA SESSION SUMMARY
Course Elapsed Days: 36
Plan Fractions Treated to Date: 27
Plan Prescribed Dose Per Fraction: 2.5 Gy
Plan Total Fractions Prescribed: 28
Plan Total Prescribed Dose: 70 Gy
Reference Point Dosage Given to Date: 67.5 Gy
Reference Point Session Dosage Given: 2.5 Gy
Session Number: 27

## 2022-02-28 ENCOUNTER — Encounter: Payer: Self-pay | Admitting: Urology

## 2022-02-28 ENCOUNTER — Ambulatory Visit
Admission: RE | Admit: 2022-02-28 | Discharge: 2022-02-28 | Disposition: A | Discharge status: Home / Self Care | Payer: Medicare HMO | Source: Ambulatory Visit | Attending: Radiation Oncology | Admitting: Radiation Oncology

## 2022-02-28 ENCOUNTER — Other Ambulatory Visit: Payer: Self-pay

## 2022-02-28 DIAGNOSIS — C61 Malignant neoplasm of prostate: Secondary | ICD-10-CM | POA: Diagnosis not present

## 2022-02-28 DIAGNOSIS — Z191 Hormone sensitive malignancy status: Secondary | ICD-10-CM | POA: Diagnosis not present

## 2022-02-28 DIAGNOSIS — Z510 Encounter for antineoplastic radiation therapy: Secondary | ICD-10-CM | POA: Diagnosis not present

## 2022-02-28 LAB — RAD ONC ARIA SESSION SUMMARY
Course Elapsed Days: 37
Plan Fractions Treated to Date: 28
Plan Prescribed Dose Per Fraction: 2.5 Gy
Plan Total Fractions Prescribed: 28
Plan Total Prescribed Dose: 70 Gy
Reference Point Dosage Given to Date: 70 Gy
Reference Point Session Dosage Given: 2.5 Gy
Session Number: 28

## 2022-03-08 ENCOUNTER — Other Ambulatory Visit: Payer: Self-pay | Admitting: Urology

## 2022-03-08 DIAGNOSIS — C61 Malignant neoplasm of prostate: Secondary | ICD-10-CM

## 2022-03-08 NOTE — Progress Notes (Signed)
                                                                                                                                                             Patient Name: BRANCE HEIDELBERG MRN: [id number] DOB: 1950/01/31 Referring Physician: Junious Silk MATTHEW (Profile Not Attached) Date of Service: 02/28/2022 [facility] [place], [place]                                                        End Of Treatment Note  Diagnoses: C61-Malignant neoplasm of prostate  Cancer Staging: 72 y.o. gentleman with Stage T2a adenocarcinoma of the prostate with Gleason score of 4+3, and PSA of 11.8.   Intent: Curative  Radiation Treatment Dates: 01/22/2022 through 02/28/2022 Site Technique Total Dose (Gy) Dose per Fx (Gy) Completed Fx Beam Energies  Prostate: Prostate IMRT 70/70 2.5 28/28 6X   Narrative: The patient tolerated radiation therapy relatively well.  He did report increased frequency, urgency, hesitancy and dysuria that was managed with Flomax daily.  He also experienced some loose stools/diarrhea.  Plan: The patient will receive a call in about one month from the radiation oncology department. He will continue follow up with his urologist, Dr. Junious Silk as well.  ------------------------------------------------   Tyler Pita, MD [place]: [phone]  Fax: [phone] [email or web address]  Skype  [email or web address]

## 2022-03-19 DIAGNOSIS — K610 Anal abscess: Secondary | ICD-10-CM | POA: Diagnosis not present

## 2022-03-27 ENCOUNTER — Ambulatory Visit
Admission: RE | Admit: 2022-03-27 | Discharge: 2022-03-27 | Disposition: A | Discharge status: Home / Self Care | Payer: Medicare HMO | Source: Ambulatory Visit | Attending: Radiation Oncology | Admitting: Radiation Oncology

## 2022-03-27 NOTE — Progress Notes (Addendum)
  Radiation Oncology         [phone]) [phone] ________________________________  Name: Oscar Haley  MRN: [id number]  Date of Service: 03/27/2022  DOB: [birth year]  Post Treatment Telephone Note  Diagnosis:  72 y.o. gentleman with Stage T2a adenocarcinoma of the prostate with Gleason score of 4+3, and PSA of 11.8.   Intent: Curative  Radiation Treatment Dates: 01/22/2022 through 02/28/2022 Site Technique Total Dose (Gy) Dose per Fx (Gy) Completed Fx Beam Energies  Prostate: Prostate IMRT 70/70 2.5 28/28 6X   (as documented in provider EOT note)  Pre Treatment IPSS Score: 1 (as documented in the provider consult note)  The patient was available for call today.   Symptoms of fatigue have improved since completing therapy.  Symptoms of bladder changes have improved since completing therapy. Current symptoms include dysuria 3/10, and medications for bladder symptoms include Tamsulosin.  Symptoms of bowel changes have improved since completing therapy. Current symptoms include none, and medications for bowel symptoms include none.   Post Treatment IPSS Score: IPSS Questionnaire (AUA-7): Over the past month.   1)  How often have you had a sensation of not emptying your bladder completely after you finish urinating?  0 - Not at all  2)  How often have you had to urinate again less than two hours after you finished urinating? 2 - Less than half the time  3)  How often have you found you stopped and started again several times when you urinated?  2 - Less than half the time  4) How difficult have you found it to postpone urination?  1 - Less than 1 time in 5  5) How often have you had a weak urinary stream?  2 - Less than half the time  6) How often have you had to push or strain to begin urination?  1 - Less than 1 time in 5  7) How many times did you most typically get up to urinate from the time you went to bed until the time you got up in the morning?  3 - 3 times  Total score:  11. Which  indicates moderate symptoms  0-7 mildly symptomatic   8-19 moderately symptomatic   20-35 severely symptomatic   Patient has a scheduled follow up visit with his urologist, Dr. Junious Silk, on 04/02/2022 for ongoing surveillance. He was counseled that PSA levels will be drawn in the urology office, and was reassured that additional time is expected to improve bowel and bladder symptoms. He was encouraged to call back with concerns or questions regarding radiation.   This concludes the interview.   Leandra Kern, LPN

## 2022-03-28 DIAGNOSIS — Z0000 Encounter for general adult medical examination without abnormal findings: Secondary | ICD-10-CM | POA: Diagnosis not present

## 2022-03-28 DIAGNOSIS — Z1211 Encounter for screening for malignant neoplasm of colon: Secondary | ICD-10-CM | POA: Diagnosis not present

## 2022-03-28 DIAGNOSIS — E785 Hyperlipidemia, unspecified: Secondary | ICD-10-CM | POA: Diagnosis not present

## 2022-03-28 DIAGNOSIS — R300 Dysuria: Secondary | ICD-10-CM | POA: Diagnosis not present

## 2022-03-28 DIAGNOSIS — R7303 Prediabetes: Secondary | ICD-10-CM | POA: Diagnosis not present

## 2022-03-28 DIAGNOSIS — R69 Illness, unspecified: Secondary | ICD-10-CM | POA: Diagnosis not present

## 2022-04-02 DIAGNOSIS — R350 Frequency of micturition: Secondary | ICD-10-CM | POA: Diagnosis not present

## 2022-04-02 DIAGNOSIS — Z1211 Encounter for screening for malignant neoplasm of colon: Secondary | ICD-10-CM | POA: Diagnosis not present

## 2022-04-02 DIAGNOSIS — N401 Enlarged prostate with lower urinary tract symptoms: Secondary | ICD-10-CM | POA: Diagnosis not present

## 2022-04-02 DIAGNOSIS — C61 Malignant neoplasm of prostate: Secondary | ICD-10-CM | POA: Diagnosis not present

## 2022-04-05 DIAGNOSIS — C44329 Squamous cell carcinoma of skin of other parts of face: Secondary | ICD-10-CM | POA: Diagnosis not present

## 2022-04-05 DIAGNOSIS — L814 Other melanin hyperpigmentation: Secondary | ICD-10-CM | POA: Diagnosis not present

## 2022-04-05 DIAGNOSIS — L821 Other seborrheic keratosis: Secondary | ICD-10-CM | POA: Diagnosis not present

## 2022-04-05 DIAGNOSIS — D492 Neoplasm of unspecified behavior of bone, soft tissue, and skin: Secondary | ICD-10-CM | POA: Diagnosis not present

## 2022-04-05 DIAGNOSIS — D225 Melanocytic nevi of trunk: Secondary | ICD-10-CM | POA: Diagnosis not present

## 2022-04-05 DIAGNOSIS — L570 Actinic keratosis: Secondary | ICD-10-CM | POA: Diagnosis not present

## 2022-04-12 DIAGNOSIS — E785 Hyperlipidemia, unspecified: Secondary | ICD-10-CM | POA: Diagnosis not present

## 2022-04-12 DIAGNOSIS — Z0000 Encounter for general adult medical examination without abnormal findings: Secondary | ICD-10-CM | POA: Diagnosis not present

## 2022-04-12 DIAGNOSIS — C61 Malignant neoplasm of prostate: Secondary | ICD-10-CM | POA: Diagnosis not present

## 2022-04-12 DIAGNOSIS — R7303 Prediabetes: Secondary | ICD-10-CM | POA: Diagnosis not present

## 2022-04-19 DIAGNOSIS — D0439 Carcinoma in situ of skin of other parts of face: Secondary | ICD-10-CM | POA: Diagnosis not present

## 2022-04-27 ENCOUNTER — Encounter: Payer: Self-pay | Admitting: *Deleted

## 2022-04-27 ENCOUNTER — Encounter: Payer: Medicare HMO | Admitting: *Deleted

## 2022-04-27 ENCOUNTER — Inpatient Hospital Stay: Payer: Medicare HMO | Attending: Adult Health | Admitting: *Deleted

## 2022-04-27 DIAGNOSIS — C61 Malignant neoplasm of prostate: Secondary | ICD-10-CM

## 2022-04-27 NOTE — Progress Notes (Signed)
2 Identifiers were used for verification purposes.No vitals were taken as this was not an in-person appt.Allergies and meds reviewed and reconciled.Vaccinations reviewed/updated. Pt denies pain nor fatigue. Pt did say he has lost 5 to 6 lbs in 2 months without trying although he keeps saying he has made some dietary modifications. Diet and exercise discussed. I told him to monitor this and keep a journal and when he returns to PCP to make them aware.Pt said most of his symptoms from the radiation  are resolving, there is a decrease in the number of times getting up to bathroom at night as well as a decrease in the dysuria. He never had an issue with changes in his bowel habits. He said his urine stream is moderate flow. He has decreased taking tamsulosin 0.4 mg to eod Most recent PSA was 2.21. Next PSA labs will be in October. Pt denies smoking and drinking. SCP reviewed and completed.

## 2022-05-10 DIAGNOSIS — L570 Actinic keratosis: Secondary | ICD-10-CM | POA: Diagnosis not present

## 2022-05-10 DIAGNOSIS — L218 Other seborrheic dermatitis: Secondary | ICD-10-CM | POA: Diagnosis not present

## 2022-05-10 DIAGNOSIS — L298 Other pruritus: Secondary | ICD-10-CM | POA: Diagnosis not present

## 2022-06-25 DIAGNOSIS — M1712 Unilateral primary osteoarthritis, left knee: Secondary | ICD-10-CM | POA: Diagnosis not present

## 2022-07-10 DIAGNOSIS — C61 Malignant neoplasm of prostate: Secondary | ICD-10-CM | POA: Diagnosis not present

## 2022-07-19 DIAGNOSIS — N401 Enlarged prostate with lower urinary tract symptoms: Secondary | ICD-10-CM | POA: Diagnosis not present

## 2022-07-19 DIAGNOSIS — C61 Malignant neoplasm of prostate: Secondary | ICD-10-CM | POA: Diagnosis not present

## 2022-07-19 DIAGNOSIS — R350 Frequency of micturition: Secondary | ICD-10-CM | POA: Diagnosis not present

## 2022-08-14 DIAGNOSIS — Z8546 Personal history of malignant neoplasm of prostate: Secondary | ICD-10-CM | POA: Diagnosis not present

## 2022-08-14 DIAGNOSIS — R269 Unspecified abnormalities of gait and mobility: Secondary | ICD-10-CM | POA: Diagnosis not present

## 2022-08-14 DIAGNOSIS — F325 Major depressive disorder, single episode, in full remission: Secondary | ICD-10-CM | POA: Diagnosis not present

## 2022-08-14 DIAGNOSIS — M1990 Unspecified osteoarthritis, unspecified site: Secondary | ICD-10-CM | POA: Diagnosis not present

## 2022-08-14 DIAGNOSIS — Z85828 Personal history of other malignant neoplasm of skin: Secondary | ICD-10-CM | POA: Diagnosis not present

## 2022-08-14 DIAGNOSIS — Z008 Encounter for other general examination: Secondary | ICD-10-CM | POA: Diagnosis not present

## 2022-08-14 DIAGNOSIS — N400 Enlarged prostate without lower urinary tract symptoms: Secondary | ICD-10-CM | POA: Diagnosis not present

## 2022-08-14 DIAGNOSIS — F419 Anxiety disorder, unspecified: Secondary | ICD-10-CM | POA: Diagnosis not present

## 2022-08-14 DIAGNOSIS — Z9181 History of falling: Secondary | ICD-10-CM | POA: Diagnosis not present

## 2022-08-14 DIAGNOSIS — Z8673 Personal history of transient ischemic attack (TIA), and cerebral infarction without residual deficits: Secondary | ICD-10-CM | POA: Diagnosis not present

## 2022-08-14 DIAGNOSIS — N529 Male erectile dysfunction, unspecified: Secondary | ICD-10-CM | POA: Diagnosis not present

## 2022-08-14 DIAGNOSIS — E785 Hyperlipidemia, unspecified: Secondary | ICD-10-CM | POA: Diagnosis not present

## 2022-08-14 DIAGNOSIS — I10 Essential (primary) hypertension: Secondary | ICD-10-CM | POA: Diagnosis not present

## 2022-09-25 DIAGNOSIS — Z23 Encounter for immunization: Secondary | ICD-10-CM | POA: Diagnosis not present

## 2022-10-09 DIAGNOSIS — Z85828 Personal history of other malignant neoplasm of skin: Secondary | ICD-10-CM | POA: Diagnosis not present

## 2022-10-09 DIAGNOSIS — D492 Neoplasm of unspecified behavior of bone, soft tissue, and skin: Secondary | ICD-10-CM | POA: Diagnosis not present

## 2022-10-09 DIAGNOSIS — L2989 Other pruritus: Secondary | ICD-10-CM | POA: Diagnosis not present

## 2022-10-09 DIAGNOSIS — D225 Melanocytic nevi of trunk: Secondary | ICD-10-CM | POA: Diagnosis not present

## 2022-10-09 DIAGNOSIS — Z86007 Personal history of in-situ neoplasm of skin: Secondary | ICD-10-CM | POA: Diagnosis not present

## 2022-10-09 DIAGNOSIS — L820 Inflamed seborrheic keratosis: Secondary | ICD-10-CM | POA: Diagnosis not present

## 2022-10-09 DIAGNOSIS — L821 Other seborrheic keratosis: Secondary | ICD-10-CM | POA: Diagnosis not present

## 2022-10-09 DIAGNOSIS — Z08 Encounter for follow-up examination after completed treatment for malignant neoplasm: Secondary | ICD-10-CM | POA: Diagnosis not present

## 2022-10-09 DIAGNOSIS — L814 Other melanin hyperpigmentation: Secondary | ICD-10-CM | POA: Diagnosis not present

## 2023-01-31 DIAGNOSIS — R350 Frequency of micturition: Secondary | ICD-10-CM | POA: Diagnosis not present

## 2023-01-31 DIAGNOSIS — N5201 Erectile dysfunction due to arterial insufficiency: Secondary | ICD-10-CM | POA: Diagnosis not present

## 2023-01-31 DIAGNOSIS — N401 Enlarged prostate with lower urinary tract symptoms: Secondary | ICD-10-CM | POA: Diagnosis not present

## 2023-01-31 DIAGNOSIS — Z8546 Personal history of malignant neoplasm of prostate: Secondary | ICD-10-CM | POA: Diagnosis not present

## 2023-04-02 DIAGNOSIS — H43813 Vitreous degeneration, bilateral: Secondary | ICD-10-CM | POA: Diagnosis not present

## 2023-04-11 DIAGNOSIS — Z1211 Encounter for screening for malignant neoplasm of colon: Secondary | ICD-10-CM | POA: Diagnosis not present

## 2023-04-11 DIAGNOSIS — N529 Male erectile dysfunction, unspecified: Secondary | ICD-10-CM | POA: Diagnosis not present

## 2023-04-11 DIAGNOSIS — R7303 Prediabetes: Secondary | ICD-10-CM | POA: Diagnosis not present

## 2023-04-11 DIAGNOSIS — F418 Other specified anxiety disorders: Secondary | ICD-10-CM | POA: Diagnosis not present

## 2023-04-11 DIAGNOSIS — Z23 Encounter for immunization: Secondary | ICD-10-CM | POA: Diagnosis not present

## 2023-04-11 DIAGNOSIS — E785 Hyperlipidemia, unspecified: Secondary | ICD-10-CM | POA: Diagnosis not present

## 2023-04-11 DIAGNOSIS — Z0000 Encounter for general adult medical examination without abnormal findings: Secondary | ICD-10-CM | POA: Diagnosis not present

## 2023-04-11 DIAGNOSIS — C61 Malignant neoplasm of prostate: Secondary | ICD-10-CM | POA: Diagnosis not present

## 2023-04-18 DIAGNOSIS — Z1211 Encounter for screening for malignant neoplasm of colon: Secondary | ICD-10-CM | POA: Diagnosis not present

## 2023-05-17 DIAGNOSIS — L259 Unspecified contact dermatitis, unspecified cause: Secondary | ICD-10-CM | POA: Diagnosis not present

## 2023-05-17 DIAGNOSIS — R7303 Prediabetes: Secondary | ICD-10-CM | POA: Diagnosis not present

## 2023-08-08 IMAGING — CT CT ANGIO HEAD-NECK (W OR W/O PERF)
2 of 7 series · 6 of 27 positions shown · non-contrast
Comparison: MRI 03/13/2021

CLINICAL DATA: Stroke, follow-up. Dizziness and balance
disturbance.

EXAM:
CT ANGIOGRAPHY HEAD AND NECK
TECHNIQUE: Multidetector CT imaging of the head and neck was performed using
the standard protocol during bolus administration of intravenous
contrast. Multiplanar CT image reconstructions and MIPs were
obtained to evaluate the vascular anatomy. Carotid stenosis
measurements (when applicable) are obtained utilizing NASCET
criteria, using the distal internal carotid diameter as the
denominator.

[Series 7: cta neck axial · axial · 0.39mm/px · z∈[-284,-57]mm · 5 of 341 slices shown]
[im 57/341  soft-tissue]
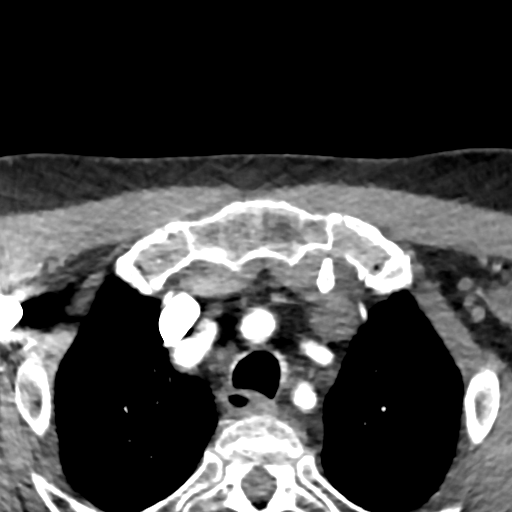
[im 114/341  bone]
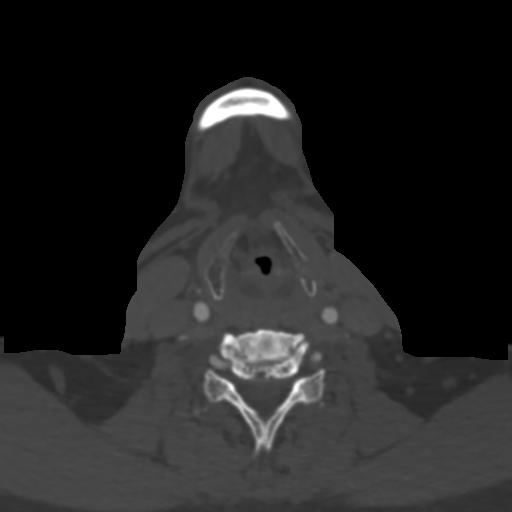
[im 171/341  soft-tissue]
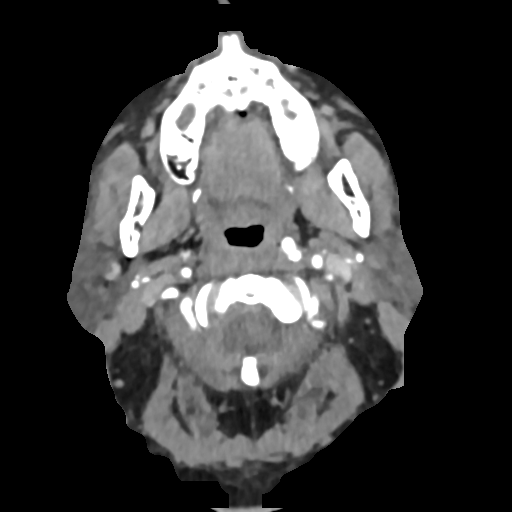
[im 227/341  bone]
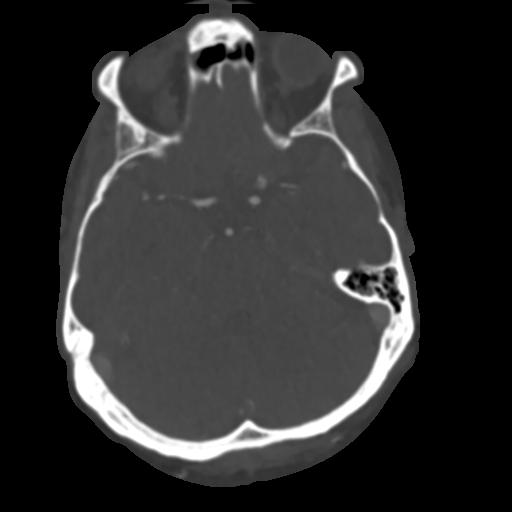
[im 284/341  soft-tissue]
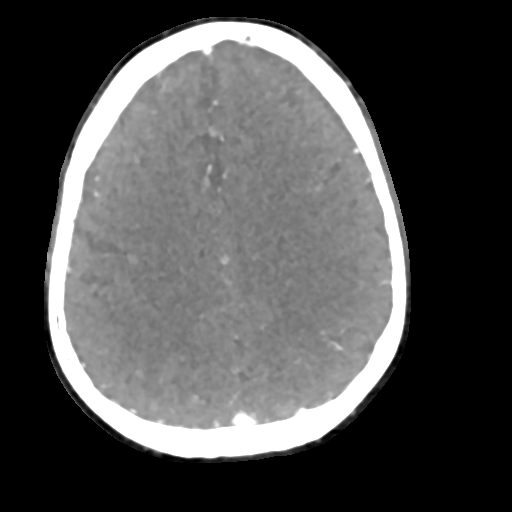

[Series 16: sagittal · sagittal · 0.32mm/px · 1 of 68 slices shown]
[im 34/68  soft-tissue]
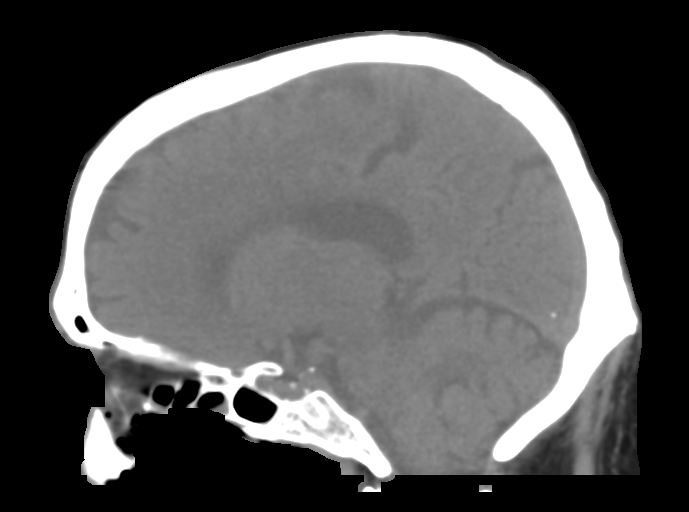

[6 of 27 positions shown; findings below may reference images not displayed]

RADIATION DOSE REDUCTION: This exam was performed according to the
departmental dose-optimization program which includes automated
exposure control, adjustment of the mA and/or kV according to
patient size and/or use of iterative reconstruction technique.

CONTRAST:  75mL DCF6RC-4F0 IOPAMIDOL (DCF6RC-4F0) INJECTION 76%
FINDINGS: CT HEAD FINDINGS

Brain: Old left cerebellar infarction as seen by MRI. Cerebral
hemispheres show minimal small vessel change of the deep white
matter. No sign of acute infarction, mass lesion, hemorrhage,
hydrocephalus or extra-axial collection.

Vascular: There is atherosclerotic calcification of the major
vessels at the base of the brain.

Skull: Normal

Sinuses: Mucosal inflammatory changes of the paranasal sinuses.

Orbits: Normal

Review of the MIP images confirms the above findings

CTA NECK FINDINGS

Aortic arch: Normal. No atherosclerotic change. Branching pattern is
normal. No origin stenosis affecting the brachiocephalic vessels.
Some calcified plaque at the left subclavian artery origin but
without stenosis.

Right carotid system: Common carotid artery widely patent to the
bifurcation. Calcified plaque of the ICA bulb but no narrowing of
the ICA compared to the more distal diameter, therefore no stenosis.

Left carotid system: Common carotid artery widely patent to the
bifurcation. Calcified plaque at the carotid bifurcation and ICA
bulb, but no narrowing of the ICA when compared to the more distal
diameter, therefore no stenosis.

Vertebral arteries: Both vertebral artery origins are widely patent.
Both vertebral arteries appear normal through the cervical region to
the foramen magnum.

Skeleton: Ordinary cervical spondylosis and facet arthritis.

Other neck: No mass or lymphadenopathy.

Upper chest: Normal

Review of the MIP images confirms the above findings

CTA HEAD FINDINGS

Anterior circulation: Both internal carotid arteries are patent
through the skull base and siphon regions. Mild siphon
atherosclerotic calcification but no stenosis. The anterior and
middle cerebral vessels are patent. No large vessel occlusion or
proximal stenosis.

Posterior circulation: Both vertebral arteries are patent through
the foramen magnum to the basilar. No basilar stenosis. Posterior
circulation branch vessels are normal.

Venous sinuses: Patent and normal.

Anatomic variants: None significant.

Review of the MIP images confirms the above findings
IMPRESSION: Atherosclerotic change at both carotid bifurcation and ICA bulb
regions but no stenosis.

Atherosclerotic calcification in both carotid siphon regions but no
stenosis.

No intracranial large vessel occlusion or correctable proximal
stenosis.

## 2023-10-07 DIAGNOSIS — Z23 Encounter for immunization: Secondary | ICD-10-CM | POA: Diagnosis not present

## 2023-10-09 DIAGNOSIS — D1801 Hemangioma of skin and subcutaneous tissue: Secondary | ICD-10-CM | POA: Diagnosis not present

## 2023-10-09 DIAGNOSIS — L821 Other seborrheic keratosis: Secondary | ICD-10-CM | POA: Diagnosis not present

## 2023-10-09 DIAGNOSIS — Z85828 Personal history of other malignant neoplasm of skin: Secondary | ICD-10-CM | POA: Diagnosis not present

## 2023-10-09 DIAGNOSIS — L538 Other specified erythematous conditions: Secondary | ICD-10-CM | POA: Diagnosis not present

## 2023-10-09 DIAGNOSIS — L570 Actinic keratosis: Secondary | ICD-10-CM | POA: Diagnosis not present

## 2023-10-09 DIAGNOSIS — Z86007 Personal history of in-situ neoplasm of skin: Secondary | ICD-10-CM | POA: Diagnosis not present

## 2023-10-09 DIAGNOSIS — Z08 Encounter for follow-up examination after completed treatment for malignant neoplasm: Secondary | ICD-10-CM | POA: Diagnosis not present

## 2023-10-09 DIAGNOSIS — L814 Other melanin hyperpigmentation: Secondary | ICD-10-CM | POA: Diagnosis not present

## 2023-10-09 DIAGNOSIS — L820 Inflamed seborrheic keratosis: Secondary | ICD-10-CM | POA: Diagnosis not present

## 2023-10-15 DIAGNOSIS — Z23 Encounter for immunization: Secondary | ICD-10-CM | POA: Diagnosis not present
